# Patient Record
Sex: Female | Born: 1970
Health system: Southern US, Community
[De-identification: ages and names within clinical notes are randomized; demographics above are authoritative.]

## PROBLEM LIST (undated history)

## (undated) DIAGNOSIS — N390 Urinary tract infection, site not specified: Secondary | ICD-10-CM

## (undated) DIAGNOSIS — G43909 Migraine, unspecified, not intractable, without status migrainosus: Secondary | ICD-10-CM

## (undated) HISTORY — DX: Migraine, unspecified, not intractable, without status migrainosus: G43.909

## (undated) HISTORY — DX: Urinary tract infection, site not specified: N39.0

---

## 2009-05-15 ENCOUNTER — Emergency Department (HOSPITAL_COMMUNITY): Admission: EM | Admit: 2009-05-15 | Discharge: 2009-05-15 | Payer: Self-pay | Admitting: Emergency Medicine

## 2010-09-10 LAB — URINALYSIS, ROUTINE W REFLEX MICROSCOPIC
Hgb urine dipstick: NEGATIVE
Protein, ur: NEGATIVE mg/dL
Specific Gravity, Urine: 1.027 (ref 1.005–1.030)
Urobilinogen, UA: 1 mg/dL (ref 0.0–1.0)

## 2010-09-10 LAB — POCT I-STAT, CHEM 8
Calcium, Ion: 1.24 mmol/L (ref 1.12–1.32)
HCT: 38 % (ref 36.0–46.0)
Hemoglobin: 12.9 g/dL (ref 12.0–15.0)
Sodium: 138 mEq/L (ref 135–145)
TCO2: 25 mmol/L (ref 0–100)

## 2010-09-10 LAB — PREGNANCY, URINE: Preg Test, Ur: NEGATIVE

## 2010-09-10 LAB — RPR: RPR Ser Ql: NONREACTIVE

## 2015-04-06 ENCOUNTER — Ambulatory Visit: Payer: Self-pay | Admitting: Internal Medicine

## 2015-04-06 DIAGNOSIS — Z0289 Encounter for other administrative examinations: Secondary | ICD-10-CM

## 2015-06-07 ENCOUNTER — Ambulatory Visit (INDEPENDENT_AMBULATORY_CARE_PROVIDER_SITE_OTHER): Payer: BLUE CROSS/BLUE SHIELD | Admitting: Internal Medicine

## 2015-06-07 ENCOUNTER — Encounter: Payer: Self-pay | Admitting: Internal Medicine

## 2015-06-07 VITALS — BP 110/60 | HR 67 | Temp 98.4°F | Resp 14 | Ht 64.0 in | Wt 149.8 lb

## 2015-06-07 DIAGNOSIS — N926 Irregular menstruation, unspecified: Secondary | ICD-10-CM | POA: Diagnosis not present

## 2015-06-07 DIAGNOSIS — Z0000 Encounter for general adult medical examination without abnormal findings: Secondary | ICD-10-CM | POA: Diagnosis not present

## 2015-06-07 DIAGNOSIS — B379 Candidiasis, unspecified: Secondary | ICD-10-CM | POA: Diagnosis not present

## 2015-06-07 MED ORDER — FLUCONAZOLE 150 MG PO TABS: ORAL_TABLET | ORAL | Status: DC

## 2015-06-07 NOTE — Patient Instructions (Addendum)
We do not need any blood work today but we will get the records.   For the yeast infections we have sent in diflucan. Take 1 pill every 3 days for 2 weeks, then start taking 1 pill every week for 2 months.   Come back in about 6-12 months for a follow up. Call us with any problems or questions sooner if needed.   Health Maintenance, Female Adopting a healthy lifestyle and getting preventive care can go a long way to promote health and wellness. Talk with your health care provider about what schedule of regular examinations is right for you. This is a good chance for you to check in with your provider about disease prevention and staying healthy. In between checkups, there are plenty of things you can do on your own. Experts have done a lot of research about which lifestyle changes and preventive measures are most likely to keep you healthy. Ask your health care provider for more information. WEIGHT AND DIET  Eat a healthy diet  Be sure to include plenty of vegetables, fruits, low-fat dairy products, and lean protein.  Do not eat a lot of foods high in solid fats, added sugars, or salt.  Get regular exercise. This is one of the most important things you can do for your health.  Most adults should exercise for at least 150 minutes each week. The exercise should increase your heart rate and make you sweat (moderate-intensity exercise).  Most adults should also do strengthening exercises at least twice a week. This is in addition to the moderate-intensity exercise.  Maintain a healthy weight  Body mass index (BMI) is a measurement that can be used to identify possible weight problems. It estimates body fat based on height and weight. Your health care provider can help determine your BMI and help you achieve or maintain a healthy weight.  For females 75 years of age and older:   A BMI below 18.5 is considered underweight.  A BMI of 18.5 to 24.9 is normal.  A BMI of 25 to 29.9 is  considered overweight.  A BMI of 30 and above is considered obese.  Watch levels of cholesterol and blood lipids  You should start having your blood tested for lipids and cholesterol at 45 years of age, then have this test every 5 years.  You may need to have your cholesterol levels checked more often if:  Your lipid or cholesterol levels are high.  You are older than 45 years of age.  You are at high risk for heart disease.  CANCER SCREENING   Lung Cancer  Lung cancer screening is recommended for adults 79-54 years old who are at high risk for lung cancer because of a history of smoking.  A yearly low-dose CT scan of the lungs is recommended for people who:  Currently smoke.  Have quit within the past 15 years.  Have at least a 30-pack-year history of smoking. A pack year is smoking an average of one pack of cigarettes a day for 1 year.  Yearly screening should continue until it has been 15 years since you quit.  Yearly screening should stop if you develop a health problem that would prevent you from having lung cancer treatment.  Breast Cancer  Practice breast self-awareness. This means understanding how your breasts normally appear and feel.  It also means doing regular breast self-exams. Let your health care provider know about any changes, no matter how small.  If you are in your 80s  or 60s, you should have a clinical breast exam (CBE) by a health care provider every 1-3 years as part of a regular health exam.  If you are 51 or older, have a CBE every year. Also consider having a breast X-ray (mammogram) every year.  If you have a family history of breast cancer, talk to your health care provider about genetic screening.  If you are at high risk for breast cancer, talk to your health care provider about having an MRI and a mammogram every year.  Breast cancer gene (BRCA) assessment is recommended for women who have family members with BRCA-related cancers.  BRCA-related cancers include:  Breast.  Ovarian.  Tubal.  Peritoneal cancers.  Results of the assessment will determine the need for genetic counseling and BRCA1 and BRCA2 testing. Cervical Cancer Your health care provider may recommend that you be screened regularly for cancer of the pelvic organs (ovaries, uterus, and vagina). This screening involves a pelvic examination, including checking for microscopic changes to the surface of your cervix (Pap test). You may be encouraged to have this screening done every 3 years, beginning at age 30.  For women ages 78-65, health care providers may recommend pelvic exams and Pap testing every 3 years, or they may recommend the Pap and pelvic exam, combined with testing for human papilloma virus (HPV), every 5 years. Some types of HPV increase your risk of cervical cancer. Testing for HPV may also be done on women of any age with unclear Pap test results.  Other health care providers may not recommend any screening for nonpregnant women who are considered low risk for pelvic cancer and who do not have symptoms. Ask your health care provider if a screening pelvic exam is right for you.  If you have had past treatment for cervical cancer or a condition that could lead to cancer, you need Pap tests and screening for cancer for at least 20 years after your treatment. If Pap tests have been discontinued, your risk factors (such as having a new sexual partner) need to be reassessed to determine if screening should resume. Some women have medical problems that increase the chance of getting cervical cancer. In these cases, your health care provider may recommend more frequent screening and Pap tests. Colorectal Cancer  This type of cancer can be detected and often prevented.  Routine colorectal cancer screening usually begins at 45 years of age and continues through 45 years of age.  Your health care provider may recommend screening at an earlier age if you  have risk factors for colon cancer.  Your health care provider may also recommend using home test kits to check for hidden blood in the stool.  A small camera at the end of a tube can be used to examine your colon directly (sigmoidoscopy or colonoscopy). This is done to check for the earliest forms of colorectal cancer.  Routine screening usually begins at age 66.  Direct examination of the colon should be repeated every 5-10 years through 45 years of age. However, you may need to be screened more often if early forms of precancerous polyps or small growths are found. Skin Cancer  Check your skin from head to toe regularly.  Tell your health care provider about any new moles or changes in moles, especially if there is a change in a mole's shape or color.  Also tell your health care provider if you have a mole that is larger than the size of a pencil eraser.  Always use sunscreen. Apply sunscreen liberally and repeatedly throughout the day.  Protect yourself by wearing long sleeves, pants, a wide-brimmed hat, and sunglasses whenever you are outside. HEART DISEASE, DIABETES, AND HIGH BLOOD PRESSURE   High blood pressure causes heart disease and increases the risk of stroke. High blood pressure is more likely to develop in:  People who have blood pressure in the high end of the normal range (130-139/85-89 mm Hg).  People who are overweight or obese.  People who are African American.  If you are 90-33 years of age, have your blood pressure checked every 3-5 years. If you are 12 years of age or older, have your blood pressure checked every year. You should have your blood pressure measured twice--once when you are at a hospital or clinic, and once when you are not at a hospital or clinic. Record the average of the two measurements. To check your blood pressure when you are not at a hospital or clinic, you can use:  An automated blood pressure machine at a pharmacy.  A home blood pressure  monitor.  If you are between 24 years and 60 years old, ask your health care provider if you should take aspirin to prevent strokes.  Have regular diabetes screenings. This involves taking a blood sample to check your fasting blood sugar level.  If you are at a normal weight and have a low risk for diabetes, have this test once every three years after 44 years of age.  If you are overweight and have a high risk for diabetes, consider being tested at a younger age or more often. PREVENTING INFECTION  Hepatitis B  If you have a higher risk for hepatitis B, you should be screened for this virus. You are considered at high risk for hepatitis B if:  You were born in a country where hepatitis B is common. Ask your health care provider which countries are considered high risk.  Your parents were born in a high-risk country, and you have not been immunized against hepatitis B (hepatitis B vaccine).  You have HIV or AIDS.  You use needles to inject street drugs.  You live with someone who has hepatitis B.  You have had sex with someone who has hepatitis B.  You get hemodialysis treatment.  You take certain medicines for conditions, including cancer, organ transplantation, and autoimmune conditions. Hepatitis C  Blood testing is recommended for:  Everyone born from [birth year] through 1966.  Anyone with known risk factors for hepatitis C. Sexually transmitted infections (STIs)  You should be screened for sexually transmitted infections (STIs) including gonorrhea and chlamydia if:  You are sexually active and are younger than 45 years of age.  You are older than 45 years of age and your health care provider tells you that you are at risk for this type of infection.  Your sexual activity has changed since you were last screened and you are at an increased risk for chlamydia or gonorrhea. Ask your health care provider if you are at risk.  If you do not have HIV, but are at risk, it may be  recommended that you take a prescription medicine daily to prevent HIV infection. This is called pre-exposure prophylaxis (PrEP). You are considered at risk if:  You are sexually active and do not regularly use condoms or know the HIV status of your partner(s).  You take drugs by injection.  You are sexually active with a partner who has HIV. Talk with your health  care provider about whether you are at high risk of being infected with HIV. If you choose to begin PrEP, you should first be tested for HIV. You should then be tested every 3 months for as long as you are taking PrEP.  PREGNANCY   If you are premenopausal and you may become pregnant, ask your health care provider about preconception counseling.  If you may become pregnant, take 400 to 800 micrograms (mcg) of folic acid every day.  If you want to prevent pregnancy, talk to your health care provider about birth control (contraception). OSTEOPOROSIS AND MENOPAUSE   Osteoporosis is a disease in which the bones lose minerals and strength with aging. This can result in serious bone fractures. Your risk for osteoporosis can be identified using a bone density scan.  If you are 52 years of age or older, or if you are at risk for osteoporosis and fractures, ask your health care provider if you should be screened.  Ask your health care provider whether you should take a calcium or vitamin D supplement to lower your risk for osteoporosis.  Menopause may have certain physical symptoms and risks.  Hormone replacement therapy may reduce some of these symptoms and risks. Talk to your health care provider about whether hormone replacement therapy is right for you.  HOME CARE INSTRUCTIONS   Schedule regular health, dental, and eye exams.  Stay current with your immunizations.   Do not use any tobacco products including cigarettes, chewing tobacco, or electronic cigarettes.  If you are pregnant, do not drink alcohol.  If you are  breastfeeding, limit how much and how often you drink alcohol.  Limit alcohol intake to no more than 1 drink per day for nonpregnant women. One drink equals 12 ounces of beer, 5 ounces of wine, or 1 ounces of hard liquor.  Do not use street drugs.  Do not share needles.  Ask your health care provider for help if you need support or information about quitting drugs.  Tell your health care provider if you often feel depressed.  Tell your health care provider if you have ever been abused or do not feel safe at home.   This information is not intended to replace advice given to you by your health care provider. Make sure you discuss any questions you have with your health care provider.   Document Released: 12/09/2010 Document Revised: 06/16/2014 Document Reviewed: 04/27/2013 Elsevier Interactive Patient Education [employer].

## 2015-06-07 NOTE — Progress Notes (Signed)
Pre visit review using our clinic review tool, if applicable. No additional management support is needed unless otherwise documented below in the visit note. ° °

## 2015-06-07 NOTE — Progress Notes (Signed)
   Subjective:    Patient ID: Lauren Olson, female    DOB: 09/16/1970, 44 y.o.   MRN: [id number]  HPI The patient is a 44 YO female coming in new for recurrent yeast infections. They are associated with intercourse causing them. She is concerned and this has been tested for STDs in the last several months. LMP date unknown but she thinks in the last 6-8 weeks. Has had unprotected sex in that time.   [facility] reviewed and updated.   Review of Systems  Constitutional: Negative for fever, activity change, appetite change, fatigue and unexpected weight change.  HENT: Negative.   Eyes: Negative.   Respiratory: Negative for cough, chest tightness, shortness of breath and wheezing.   Cardiovascular: Negative for chest pain, palpitations and leg swelling.  Gastrointestinal: Negative for nausea, abdominal pain, diarrhea, constipation and abdominal distention.  Genitourinary:       Recurrent yeast infection.   Musculoskeletal: Negative.   Skin: Negative.   Neurological: Negative.   Psychiatric/Behavioral: Negative.       Objective:   Physical Exam  Constitutional: She is oriented to person, place, and time. She appears well-developed and well-nourished.  HENT:  Head: Normocephalic and atraumatic.  Eyes: EOM are normal.  Neck: Normal range of motion.  Cardiovascular: Normal rate and regular rhythm.   Pulmonary/Chest: Effort normal and breath sounds normal. No respiratory distress. She has no wheezes. She has no rales.  Abdominal: Soft. Bowel sounds are normal. She exhibits no distension. There is no tenderness. There is no rebound.  Musculoskeletal: She exhibits no edema.  Neurological: She is alert and oriented to person, place, and time. Coordination normal.  Skin: Skin is warm and dry.  Psychiatric: She has a normal mood and affect.   Filed Vitals:   06/07/15 1556  BP: 110/60  Pulse: 67  Temp: 98.4 F (36.9 C)  TempSrc: Oral  Resp: 14  Height: 5\' 4"  (1.626 m)   Weight: 149 lb 12.8 oz (67.949 kg)  SpO2: 99%      Assessment & Plan:

## 2015-06-08 DIAGNOSIS — B379 Candidiasis, unspecified: Secondary | ICD-10-CM | POA: Insufficient documentation

## 2015-06-08 NOTE — Assessment & Plan Note (Signed)
Checking labs, non-smoker and exercises.

## 2015-06-08 NOTE — Assessment & Plan Note (Signed)
Rx for diflucan every 3 days for 2 weeks, then weekly for 2 months.

## 2015-11-19 ENCOUNTER — Telehealth: Payer: Self-pay | Admitting: Internal Medicine

## 2015-11-19 MED ORDER — FLUCONAZOLE 150 MG PO TABS: ORAL_TABLET | ORAL | Status: DC

## 2015-11-19 NOTE — Telephone Encounter (Signed)
Pt called in and would like you to call her when you get a chance.  She would not tell me why was going on.  She just requested that you call her

## 2015-11-19 NOTE — Telephone Encounter (Signed)
Patient is still getting yeast infections. She said the diflucan helped, but she is out. She sees you in two weeks. She wants to know if you can send in more diflucan. She has also scheduled an appt with her gynecologist. She says she just needs some relief until she can get in with her gynecologist.

## 2015-11-19 NOTE — Telephone Encounter (Signed)
Patient aware.

## 2015-11-19 NOTE — Telephone Encounter (Signed)
Sent in refill.

## 2015-11-22 ENCOUNTER — Other Ambulatory Visit: Payer: Self-pay | Admitting: Obstetrics and Gynecology

## 2015-11-22 ENCOUNTER — Other Ambulatory Visit (HOSPITAL_COMMUNITY)
Admission: RE | Admit: 2015-11-22 | Discharge: 2015-11-22 | Disposition: A | Discharge status: Home / Self Care | Payer: BLUE CROSS/BLUE SHIELD | Source: Ambulatory Visit | Attending: Obstetrics and Gynecology | Admitting: Obstetrics and Gynecology

## 2015-11-22 DIAGNOSIS — Z113 Encounter for screening for infections with a predominantly sexual mode of transmission: Secondary | ICD-10-CM | POA: Diagnosis present

## 2015-11-22 DIAGNOSIS — Z01419 Encounter for gynecological examination (general) (routine) without abnormal findings: Secondary | ICD-10-CM | POA: Diagnosis present

## 2015-11-22 DIAGNOSIS — Z1151 Encounter for screening for human papillomavirus (HPV): Secondary | ICD-10-CM | POA: Insufficient documentation

## 2015-11-22 DIAGNOSIS — N761 Subacute and chronic vaginitis: Secondary | ICD-10-CM | POA: Diagnosis not present

## 2015-11-22 DIAGNOSIS — N750 Cyst of Bartholin's gland: Secondary | ICD-10-CM | POA: Diagnosis not present

## 2015-11-22 DIAGNOSIS — Z01411 Encounter for gynecological examination (general) (routine) with abnormal findings: Secondary | ICD-10-CM | POA: Diagnosis not present

## 2015-11-22 DIAGNOSIS — B373 Candidiasis of vulva and vagina: Secondary | ICD-10-CM | POA: Diagnosis not present

## 2015-11-26 LAB — CYTOLOGY - PAP

## 2015-12-03 ENCOUNTER — Ambulatory Visit (INDEPENDENT_AMBULATORY_CARE_PROVIDER_SITE_OTHER): Payer: BLUE CROSS/BLUE SHIELD | Admitting: Internal Medicine

## 2015-12-03 ENCOUNTER — Encounter: Payer: Self-pay | Admitting: Internal Medicine

## 2015-12-03 VITALS — BP 122/58 | HR 88 | Temp 98.7°F | Resp 12 | Ht 64.0 in | Wt 141.0 lb

## 2015-12-03 DIAGNOSIS — B379 Candidiasis, unspecified: Secondary | ICD-10-CM

## 2015-12-03 NOTE — Progress Notes (Signed)
Pre visit review using our clinic review tool, if applicable. No additional management support is needed unless otherwise documented below in the visit note. ° °

## 2015-12-03 NOTE — Patient Instructions (Signed)
It is worth trying to take a probiotic for 1 month to see if this helps, both you and your husband.

## 2015-12-03 NOTE — Progress Notes (Signed)
° °  Subjective:    Patient ID: Lauren Olson, female    DOB: August 03, 1970, 45 y.o.   MRN: [id number]  HPI The patient is a 45 YO female coming in for follow up of her yeast infections. She was doing well after being on the medicine for about 2 months. She started getting them back again several weeks ago. Seeing gynecologist and they are working on it. Still with a lot of problems after sexual activity.   Review of Systems  Constitutional: Negative for fever, activity change, appetite change, fatigue and unexpected weight change.  Respiratory: Negative for cough, chest tightness, shortness of breath and wheezing.   Cardiovascular: Negative for chest pain, palpitations and leg swelling.  Gastrointestinal: Negative for nausea, abdominal pain, diarrhea, constipation and abdominal distention.  Genitourinary:       Recurrent yeast infection.   Musculoskeletal: Negative.   Skin: Negative.   Neurological: Negative.   Psychiatric/Behavioral: Negative.       Objective:   Physical Exam  Constitutional: She is oriented to person, place, and time. She appears well-developed and well-nourished.  HENT:  Head: Normocephalic and atraumatic.  Eyes: EOM are normal.  Neck: Normal range of motion.  Cardiovascular: Normal rate and regular rhythm.   Pulmonary/Chest: Effort normal and breath sounds normal. No respiratory distress. She has no wheezes. She has no rales.  Abdominal: Soft. Bowel sounds are normal. She exhibits no distension. There is no tenderness. There is no rebound.  Musculoskeletal: She exhibits no edema.  Neurological: She is alert and oriented to person, place, and time. Coordination normal.  Skin: Skin is warm and dry.   Filed Vitals:   12/03/15 1603  BP: 122/58  Pulse: 88  Temp: 98.7 F (37.1 C)  TempSrc: Oral  Resp: 12  Height: 5\' 4"  (1.626 m)  Weight: 141 lb (63.957 kg)  SpO2: 99%      Assessment & Plan:

## 2015-12-04 NOTE — Assessment & Plan Note (Signed)
Talked to them about taking probiotic both her and her husband to see if this helps.

## 2016-02-18 ENCOUNTER — Other Ambulatory Visit: Payer: Self-pay | Admitting: Obstetrics and Gynecology

## 2016-02-18 DIAGNOSIS — Z1231 Encounter for screening mammogram for malignant neoplasm of breast: Secondary | ICD-10-CM

## 2016-02-26 ENCOUNTER — Ambulatory Visit
Admission: RE | Admit: 2016-02-26 | Discharge: 2016-02-26 | Disposition: A | Discharge status: Home / Self Care | Payer: BLUE CROSS/BLUE SHIELD | Source: Ambulatory Visit | Attending: Obstetrics and Gynecology | Admitting: Obstetrics and Gynecology

## 2016-02-26 DIAGNOSIS — Z1231 Encounter for screening mammogram for malignant neoplasm of breast: Secondary | ICD-10-CM

## 2016-02-27 DIAGNOSIS — M9903 Segmental and somatic dysfunction of lumbar region: Secondary | ICD-10-CM | POA: Diagnosis not present

## 2016-02-27 DIAGNOSIS — M5386 Other specified dorsopathies, lumbar region: Secondary | ICD-10-CM | POA: Diagnosis not present

## 2016-02-27 DIAGNOSIS — M9905 Segmental and somatic dysfunction of pelvic region: Secondary | ICD-10-CM | POA: Diagnosis not present

## 2016-02-27 DIAGNOSIS — M9902 Segmental and somatic dysfunction of thoracic region: Secondary | ICD-10-CM | POA: Diagnosis not present

## 2016-03-19 ENCOUNTER — Other Ambulatory Visit (INDEPENDENT_AMBULATORY_CARE_PROVIDER_SITE_OTHER): Payer: BLUE CROSS/BLUE SHIELD

## 2016-03-19 ENCOUNTER — Ambulatory Visit (INDEPENDENT_AMBULATORY_CARE_PROVIDER_SITE_OTHER): Payer: BLUE CROSS/BLUE SHIELD | Admitting: Internal Medicine

## 2016-03-19 ENCOUNTER — Encounter: Payer: Self-pay | Admitting: Internal Medicine

## 2016-03-19 VITALS — BP 100/70 | HR 71 | Temp 98.4°F | Resp 14 | Ht 65.0 in | Wt 139.0 lb

## 2016-03-19 DIAGNOSIS — Z0000 Encounter for general adult medical examination without abnormal findings: Secondary | ICD-10-CM | POA: Diagnosis not present

## 2016-03-19 LAB — COMPREHENSIVE METABOLIC PANEL
ALT: 8 U/L (ref 0–35)
AST: 21 U/L (ref 0–37)
Albumin: 4.1 g/dL (ref 3.5–5.2)
Alkaline Phosphatase: 58 U/L (ref 39–117)
BUN: 10 mg/dL (ref 6–23)
CO2: 26 meq/L (ref 19–32)
Calcium: 9.4 mg/dL (ref 8.4–10.5)
Chloride: 105 mEq/L (ref 96–112)
Creatinine, Ser: 0.69 mg/dL (ref 0.40–1.20)
GFR: 117.77 mL/min (ref >60.00)
GLUCOSE: 86 mg/dL (ref 70–99)
POTASSIUM: 3.9 meq/L (ref 3.5–5.1)
Sodium: 139 mEq/L (ref 135–145)
Total Bilirubin: 0.3 mg/dL (ref 0.2–1.2)
Total Protein: 7.9 g/dL (ref 6.0–8.3)

## 2016-03-19 LAB — URINALYSIS, ROUTINE W REFLEX MICROSCOPIC
Bilirubin Urine: NEGATIVE
HGB URINE DIPSTICK: NEGATIVE
LEUKOCYTES UA: NEGATIVE
Nitrite: NEGATIVE
Specific Gravity, Urine: 1.015 (ref 1.000–1.030)
Total Protein, Urine: NEGATIVE
URINE GLUCOSE: NEGATIVE
Urobilinogen, UA: 1 (ref 0.0–1.0)
pH: 7 (ref 5.0–8.0)

## 2016-03-19 LAB — LIPID PANEL
CHOL/HDL RATIO: 3
Cholesterol: 154 mg/dL (ref 0–200)
HDL: 59.9 mg/dL (ref >39.00)
LDL Cholesterol: 82 mg/dL (ref 0–99)
NONHDL: 94.24
Triglycerides: 63 mg/dL (ref 0.0–149.0)
VLDL: 12.6 mg/dL (ref 0.0–40.0)

## 2016-03-19 LAB — CBC
HEMATOCRIT: 30.5 % — AB (ref 36.0–46.0)
HEMOGLOBIN: 9.6 g/dL — AB (ref 12.0–15.0)
MCHC: 31.4 g/dL (ref 30.0–36.0)
MCV: 75.4 fl — AB (ref 78.0–100.0)
PLATELETS: 308 10*3/uL (ref 150.0–400.0)
RBC: 4.04 Mil/uL (ref 3.87–5.11)
RDW: 16.7 % — ABNORMAL HIGH (ref 11.5–15.5)
WBC: 5.7 10*3/uL (ref 4.0–10.5)

## 2016-03-19 NOTE — Progress Notes (Signed)
Pre visit review using our clinic review tool, if applicable. No additional management support is needed unless otherwise documented below in the visit note. ° °

## 2016-03-19 NOTE — Assessment & Plan Note (Signed)
Checking labs, form filled out for her work. Declines flu shot and hiv screening need. Counseled on exercise and dangers of distracted driving. Given screening recommendations. Not taking any meds.

## 2016-03-19 NOTE — Patient Instructions (Signed)
We will check the labs and the urine today and will call you back about the results.  Keep up the good work with the health.   Health Maintenance, Female Adopting a healthy lifestyle and getting preventive care can go a long way to promote health and wellness. Talk with your health care provider about what schedule of regular examinations is right for you. This is a good chance for you to check in with your provider about disease prevention and staying healthy. In between checkups, there are plenty of things you can do on your own. Experts have done a lot of research about which lifestyle changes and preventive measures are most likely to keep you healthy. Ask your health care provider for more information. WEIGHT AND DIET  Eat a healthy diet  Be sure to include plenty of vegetables, fruits, low-fat dairy products, and lean protein.  Do not eat a lot of foods high in solid fats, added sugars, or salt.  Get regular exercise. This is one of the most important things you can do for your health.  Most adults should exercise for at least 150 minutes each week. The exercise should increase your heart rate and make you sweat (moderate-intensity exercise).  Most adults should also do strengthening exercises at least twice a week. This is in addition to the moderate-intensity exercise.  Maintain a healthy weight  Body mass index (BMI) is a measurement that can be used to identify possible weight problems. It estimates body fat based on height and weight. Your health care provider can help determine your BMI and help you achieve or maintain a healthy weight.  For females 68 years of age and older:   A BMI below 18.5 is considered underweight.  A BMI of 18.5 to 24.9 is normal.  A BMI of 25 to 29.9 is considered overweight.  A BMI of 30 and above is considered obese.  Watch levels of cholesterol and blood lipids  You should start having your blood tested for lipids and cholesterol at 46  years of age, then have this test every 5 years.  You may need to have your cholesterol levels checked more often if:  Your lipid or cholesterol levels are high.  You are older than 47 years of age.  You are at high risk for heart disease.  CANCER SCREENING   Lung Cancer  Lung cancer screening is recommended for adults 29-12 years old who are at high risk for lung cancer because of a history of smoking.  A yearly low-dose CT scan of the lungs is recommended for people who:  Currently smoke.  Have quit within the past 15 years.  Have at least a 30-pack-year history of smoking. A pack year is smoking an average of one pack of cigarettes a day for 1 year.  Yearly screening should continue until it has been 15 years since you quit.  Yearly screening should stop if you develop a health problem that would prevent you from having lung cancer treatment.  Breast Cancer  Practice breast self-awareness. This means understanding how your breasts normally appear and feel.  It also means doing regular breast self-exams. Let your health care provider know about any changes, no matter how small.  If you are in your 20s or 30s, you should have a clinical breast exam (CBE) by a health care provider every 1-3 years as part of a regular health exam.  If you are 49 or older, have a CBE every year. Also consider having  a breast X-ray (mammogram) every year.  If you have a family history of breast cancer, talk to your health care provider about genetic screening.  If you are at high risk for breast cancer, talk to your health care provider about having an MRI and a mammogram every year.  Breast cancer gene (BRCA) assessment is recommended for women who have family members with BRCA-related cancers. BRCA-related cancers include:  Breast.  Ovarian.  Tubal.  Peritoneal cancers.  Results of the assessment will determine the need for genetic counseling and BRCA1 and BRCA2 testing. Cervical  Cancer Your health care provider may recommend that you be screened regularly for cancer of the pelvic organs (ovaries, uterus, and vagina). This screening involves a pelvic examination, including checking for microscopic changes to the surface of your cervix (Pap test). You may be encouraged to have this screening done every 3 years, beginning at age 73.  For women ages 81-65, health care providers may recommend pelvic exams and Pap testing every 3 years, or they may recommend the Pap and pelvic exam, combined with testing for human papilloma virus (HPV), every 5 years. Some types of HPV increase your risk of cervical cancer. Testing for HPV may also be done on women of any age with unclear Pap test results.  Other health care providers may not recommend any screening for nonpregnant women who are considered low risk for pelvic cancer and who do not have symptoms. Ask your health care provider if a screening pelvic exam is right for you.  If you have had past treatment for cervical cancer or a condition that could lead to cancer, you need Pap tests and screening for cancer for at least 20 years after your treatment. If Pap tests have been discontinued, your risk factors (such as having a new sexual partner) need to be reassessed to determine if screening should resume. Some women have medical problems that increase the chance of getting cervical cancer. In these cases, your health care provider may recommend more frequent screening and Pap tests. Colorectal Cancer  This type of cancer can be detected and often prevented.  Routine colorectal cancer screening usually begins at 46 years of age and continues through 46 years of age.  Your health care provider may recommend screening at an earlier age if you have risk factors for colon cancer.  Your health care provider may also recommend using home test kits to check for hidden blood in the stool.  A small camera at the end of a tube can be used to  examine your colon directly (sigmoidoscopy or colonoscopy). This is done to check for the earliest forms of colorectal cancer.  Routine screening usually begins at age 83.  Direct examination of the colon should be repeated every 5-10 years through 46 years of age. However, you may need to be screened more often if early forms of precancerous polyps or small growths are found. Skin Cancer  Check your skin from head to toe regularly.  Tell your health care provider about any new moles or changes in moles, especially if there is a change in a mole's shape or color.  Also tell your health care provider if you have a mole that is larger than the size of a pencil eraser.  Always use sunscreen. Apply sunscreen liberally and repeatedly throughout the day.  Protect yourself by wearing long sleeves, pants, a wide-brimmed hat, and sunglasses whenever you are outside. HEART DISEASE, DIABETES, AND HIGH BLOOD PRESSURE   High blood pressure  causes heart disease and increases the risk of stroke. High blood pressure is more likely to develop in:  People who have blood pressure in the high end of the normal range (130-139/85-89 mm Hg).  People who are overweight or obese.  People who are African American.  If you are 52-22 years of age, have your blood pressure checked every 3-5 years. If you are 33 years of age or older, have your blood pressure checked every year. You should have your blood pressure measured twice--once when you are at a hospital or clinic, and once when you are not at a hospital or clinic. Record the average of the two measurements. To check your blood pressure when you are not at a hospital or clinic, you can use:  An automated blood pressure machine at a pharmacy.  A home blood pressure monitor.  If you are between 62 years and 48 years old, ask your health care provider if you should take aspirin to prevent strokes.  Have regular diabetes screenings. This involves taking a  blood sample to check your fasting blood sugar level.  If you are at a normal weight and have a low risk for diabetes, have this test once every three years after 47 years of age.  If you are overweight and have a high risk for diabetes, consider being tested at a younger age or more often. PREVENTING INFECTION  Hepatitis B  If you have a higher risk for hepatitis B, you should be screened for this virus. You are considered at high risk for hepatitis B if:  You were born in a country where hepatitis B is common. Ask your health care provider which countries are considered high risk.  Your parents were born in a high-risk country, and you have not been immunized against hepatitis B (hepatitis B vaccine).  You have HIV or AIDS.  You use needles to inject street drugs.  You live with someone who has hepatitis B.  You have had sex with someone who has hepatitis B.  You get hemodialysis treatment.  You take certain medicines for conditions, including cancer, organ transplantation, and autoimmune conditions. Hepatitis C  Blood testing is recommended for:  Everyone born from [birth year] through 1966.  Anyone with known risk factors for hepatitis C. Sexually transmitted infections (STIs)  You should be screened for sexually transmitted infections (STIs) including gonorrhea and chlamydia if:  You are sexually active and are younger than 46 years of age.  You are older than 46 years of age and your health care provider tells you that you are at risk for this type of infection.  Your sexual activity has changed since you were last screened and you are at an increased risk for chlamydia or gonorrhea. Ask your health care provider if you are at risk.  If you do not have HIV, but are at risk, it may be recommended that you take a prescription medicine daily to prevent HIV infection. This is called pre-exposure prophylaxis (PrEP). You are considered at risk if:  You are sexually active and do  not regularly use condoms or know the HIV status of your partner(s).  You take drugs by injection.  You are sexually active with a partner who has HIV. Talk with your health care provider about whether you are at high risk of being infected with HIV. If you choose to begin PrEP, you should first be tested for HIV. You should then be tested every 3 months for as long as you  are taking PrEP.  PREGNANCY   If you are premenopausal and you may become pregnant, ask your health care provider about preconception counseling.  If you may become pregnant, take 400 to 800 micrograms (mcg) of folic acid every day.  If you want to prevent pregnancy, talk to your health care provider about birth control (contraception). OSTEOPOROSIS AND MENOPAUSE   Osteoporosis is a disease in which the bones lose minerals and strength with aging. This can result in serious bone fractures. Your risk for osteoporosis can be identified using a bone density scan.  If you are 13 years of age or older, or if you are at risk for osteoporosis and fractures, ask your health care provider if you should be screened.  Ask your health care provider whether you should take a calcium or vitamin D supplement to lower your risk for osteoporosis.  Menopause may have certain physical symptoms and risks.  Hormone replacement therapy may reduce some of these symptoms and risks. Talk to your health care provider about whether hormone replacement therapy is right for you.  HOME CARE INSTRUCTIONS   Schedule regular health, dental, and eye exams.  Stay current with your immunizations.   Do not use any tobacco products including cigarettes, chewing tobacco, or electronic cigarettes.  If you are pregnant, do not drink alcohol.  If you are breastfeeding, limit how much and how often you drink alcohol.  Limit alcohol intake to no more than 1 drink per day for nonpregnant women. One drink equals 12 ounces of beer, 5 ounces of wine, or 1  ounces of hard liquor.  Do not use street drugs.  Do not share needles.  Ask your health care provider for help if you need support or information about quitting drugs.  Tell your health care provider if you often feel depressed.  Tell your health care provider if you have ever been abused or do not feel safe at home.   This information is not intended to replace advice given to you by your health care provider. Make sure you discuss any questions you have with your health care provider.   Document Released: 12/09/2010 Document Revised: 06/16/2014 Document Reviewed: 04/27/2013 Elsevier Interactive Patient Education [employer].

## 2016-03-19 NOTE — Progress Notes (Signed)
° °  Subjective:    Patient ID: Lauren Olson, female    DOB: 08/05/1970, 45 y.o.   MRN: [id number]  HPI The patient is a 45 YO female coming in for wellness. No new concerns.   [facility] reviewed and updated.   Review of Systems  Constitutional: Negative.   HENT: Negative.   Eyes: Negative.   Respiratory: Negative.   Cardiovascular: Negative.   Gastrointestinal: Negative.   Musculoskeletal: Negative.   Skin: Negative.   Neurological: Negative.   Psychiatric/Behavioral: Negative.       Objective:   Physical Exam  Constitutional: She is oriented to person, place, and time. She appears well-developed and well-nourished.  HENT:  Head: Normocephalic and atraumatic.  Eyes: EOM are normal.  Neck: Normal range of motion.  Cardiovascular: Normal rate and regular rhythm.   Pulmonary/Chest: Effort normal and breath sounds normal. No respiratory distress. She has no wheezes. She has no rales.  Abdominal: Soft. Bowel sounds are normal.  Musculoskeletal: She exhibits no edema.  Neurological: She is alert and oriented to person, place, and time.  Skin: Skin is warm and dry.  Psychiatric: She has a normal mood and affect.   Vitals:   03/19/16 1031  BP: 100/70  Pulse: 71  Resp: 14  Temp: 98.4 F (36.9 C)  TempSrc: Oral  SpO2: 99%  Weight: 139 lb (63 kg)  Height: 5\' 5"  (1.651 m)      Assessment & Plan:

## 2016-06-19 ENCOUNTER — Ambulatory Visit (INDEPENDENT_AMBULATORY_CARE_PROVIDER_SITE_OTHER): Payer: BLUE CROSS/BLUE SHIELD | Admitting: Internal Medicine

## 2016-06-19 ENCOUNTER — Encounter: Payer: Self-pay | Admitting: Internal Medicine

## 2016-06-19 DIAGNOSIS — R300 Dysuria: Secondary | ICD-10-CM

## 2016-06-19 LAB — POC URINALSYSI DIPSTICK (AUTOMATED)
Bilirubin, UA: NEGATIVE
GLUCOSE UA: NEGATIVE
Nitrite, UA: POSITIVE
RBC UA: NEGATIVE
SPEC GRAV UA: 1.02
UROBILINOGEN UA: 2
pH, UA: 6.5

## 2016-06-19 MED ORDER — SULFAMETHOXAZOLE-TRIMETHOPRIM 800-160 MG PO TABS: 1.0000 | ORAL_TABLET | Freq: Two times a day (BID) | ORAL | 0 refills | Status: DC

## 2016-06-19 MED ORDER — FLUCONAZOLE 150 MG PO TABS: 150.0000 mg | ORAL_TABLET | Freq: Once | ORAL | 0 refills | Status: AC

## 2016-06-19 NOTE — Patient Instructions (Signed)
You do have a bladder infection. We have sent in the antibiotic called bactrim. Take 1 pill twice a day for 5 days.   We have also sent in a diflucan pill in case you get a yeast infection from the antibiotic.    Urinary Tract Infection, Adult Introduction A urinary tract infection (UTI) is an infection of any part of the urinary tract. The urinary tract includes the:  Kidneys.  Ureters.  Bladder.  Urethra. These organs make, store, and get rid of pee (urine) in the body. Follow these instructions at home:  Take over-the-counter and prescription medicines only as told by your doctor.  If you were prescribed an antibiotic medicine, take it as told by your doctor. Do not stop taking the antibiotic even if you start to feel better.  Avoid the following drinks:  Alcohol.  Caffeine.  Tea.  Carbonated drinks.  Drink enough fluid to keep your pee clear or pale yellow.  Keep all follow-up visits as told by your doctor. This is important.  Make sure to:  Empty your bladder often and completely. Do not to hold pee for long periods of time.  Empty your bladder before and after sex.  Wipe from front to back after a bowel movement if you are female. Use each tissue one time when you wipe. Contact a doctor if:  You have back pain.  You have a fever.  You feel sick to your stomach (nauseous).  You throw up (vomit).  Your symptoms do not get better after 3 days.  Your symptoms go away and then come back. Get help right away if:  You have very bad back pain.  You have very bad lower belly (abdominal) pain.  You are throwing up and cannot keep down any medicines or water. This information is not intended to replace advice given to you by your health care provider. Make sure you discuss any questions you have with your health care provider. Document Released: 11/12/2007 Document Revised: 11/01/2015 Document Reviewed: 04/16/2015  2017 Elsevier

## 2016-06-19 NOTE — Progress Notes (Signed)
° °  Subjective:    Patient ID: Lauren Olson, female    DOB: 1971-01-14, 46 y.o.   MRN: [id number]  HPI The patient is a 46 YO female coming in for UTI symptoms. Started 2 weeks ago. No fevers or chills. No abdominal or back pain. Some suprapubic tenderness. Pain with urination and urgency and frequency. Tried drinking more fluids and also cranberry juice without relief.   Review of Systems  Constitutional: Negative for activity change, appetite change, chills, fatigue, fever and unexpected weight change.  Respiratory: Negative.   Cardiovascular: Negative.   Gastrointestinal: Positive for abdominal pain. Negative for abdominal distention, constipation, diarrhea and nausea.  Genitourinary: Positive for dysuria, frequency and urgency. Negative for enuresis, flank pain, hematuria and pelvic pain.  Musculoskeletal: Negative.       Objective:   Physical Exam  Constitutional: She is oriented to person, place, and time. She appears well-developed and well-nourished.  HENT:  Head: Normocephalic and atraumatic.  Eyes: EOM are normal.  Neck: Normal range of motion.  Cardiovascular: Normal rate and regular rhythm.   Pulmonary/Chest: Effort normal and breath sounds normal.  Abdominal: Soft. She exhibits no distension. There is tenderness. There is no rebound and no guarding.  Some suprapubic tenderness, no guarding or rebound.   Neurological: She is alert and oriented to person, place, and time.  Skin: Skin is warm and dry.   Vitals:   06/19/16 0909  BP: 110/68  Pulse: 85  Resp: 12  Temp: 98.2 F (36.8 C)  TempSrc: Oral  SpO2: 99%  Weight: 134 lb (60.8 kg)  Height: 5\' 5"  (1.651 m)      Assessment & Plan:

## 2016-06-19 NOTE — Assessment & Plan Note (Signed)
U/A in the office consistent with infection. Rx for bactrim and diflucan since she usually gets yeast infection with treatment.

## 2016-06-19 NOTE — Progress Notes (Signed)
Pre visit review using our clinic review tool, if applicable. No additional management support is needed unless otherwise documented below in the visit note. ° °

## 2016-06-24 ENCOUNTER — Other Ambulatory Visit: Payer: Self-pay | Admitting: Internal Medicine

## 2016-06-24 ENCOUNTER — Telehealth: Payer: Self-pay | Admitting: Internal Medicine

## 2016-06-24 NOTE — Telephone Encounter (Signed)
Patient states when she was last in on her AVS it states a medication for possible yeast infection was sent to her pharmacy.  Patient states pharmacy did not receive this medication.  Patient uses [facility] on [address].

## 2016-06-24 NOTE — Telephone Encounter (Signed)
This was sent in to her pharmacy at the visit.

## 2016-07-07 ENCOUNTER — Ambulatory Visit: Payer: BLUE CROSS/BLUE SHIELD | Admitting: Internal Medicine

## 2016-07-08 ENCOUNTER — Encounter: Payer: Self-pay | Admitting: Internal Medicine

## 2016-07-08 ENCOUNTER — Ambulatory Visit (INDEPENDENT_AMBULATORY_CARE_PROVIDER_SITE_OTHER): Payer: BLUE CROSS/BLUE SHIELD | Admitting: Internal Medicine

## 2016-07-08 DIAGNOSIS — B379 Candidiasis, unspecified: Secondary | ICD-10-CM

## 2016-07-08 NOTE — Assessment & Plan Note (Signed)
Advised to see her gyn to see if there are any alternatives for this chronic yeast infection or other options for prevention.

## 2016-07-08 NOTE — Progress Notes (Signed)
Pre visit review using our clinic review tool, if applicable. No additional management support is needed unless otherwise documented below in the visit note. ° °

## 2016-07-08 NOTE — Progress Notes (Signed)
° °  Subjective:    Patient ID: Lauren Olson, female    DOB: 03-04-1971, 46 y.o.   MRN: [id number]  HPI The patient is a 46 YO female coming in for recurrent yeast infections. She gets them anytime she is intimate with her husband and this is very distressing to him. He is the one who scheduled this visit. She denies any dryness or pain with intercourse. She was on treatment for a while with weekly diflucan and still had problems with intimacy. She talked to her gyn and was blown off. She has not been intimate in some time and feels good and not having yeast infection now.   Review of Systems  Constitutional: Negative.   Respiratory: Negative.   Cardiovascular: Negative.   Gastrointestinal: Negative.   Genitourinary: Negative.   Neurological: Negative.       Objective:   Physical Exam  Constitutional: She is oriented to person, place, and time. She appears well-developed and well-nourished.  HENT:  Head: Normocephalic and atraumatic.  Eyes: EOM are normal.  Cardiovascular: Normal rate and regular rhythm.   Pulmonary/Chest: Effort normal.  Abdominal: Soft. She exhibits no distension. There is no tenderness. There is no rebound.  Neurological: She is alert and oriented to person, place, and time.  Skin: Skin is warm and dry.   Vitals:   07/08/16 1418  BP: 108/62  Pulse: (!) 117  Resp: 14  Temp: 98.2 F (36.8 C)  TempSrc: Oral  SpO2: 98%  Weight: 141 lb (64 kg)  Height: 5\' 5"  (1.651 m)      Assessment & Plan:

## 2016-09-30 ENCOUNTER — Encounter: Payer: Self-pay | Admitting: Internal Medicine

## 2016-09-30 ENCOUNTER — Ambulatory Visit (INDEPENDENT_AMBULATORY_CARE_PROVIDER_SITE_OTHER): Payer: BLUE CROSS/BLUE SHIELD | Admitting: Internal Medicine

## 2016-09-30 DIAGNOSIS — R040 Epistaxis: Secondary | ICD-10-CM | POA: Diagnosis not present

## 2016-09-30 MED ORDER — FLUTICASONE PROPIONATE 50 MCG/ACT NA SUSP: 2.0000 | Freq: Every day | NASAL | 6 refills | Status: DC

## 2016-09-30 NOTE — Patient Instructions (Addendum)
We have sent in the nose spray for the allergies called flonase. Take 2 puffs in each nostril once a day.   Use vaseline on the nose twice a day to help moisturize the skin for the next couple of weeks.   It is okay to use affrin to stop a nosebleed.    Nosebleed, Adult A nosebleed is when blood comes out of the nose. Nosebleeds are common. Usually, they are not a sign of a serious condition. Nosebleeds can happen if a small blood vessel in your nose starts to bleed or if the lining of your nose (mucous membrane) cracks. They are commonly caused by:  Allergies.  Colds.  Picking your nose.  Blowing your nose too hard.  An injury from sticking an object into your nose or getting hit in the nose.  Dry or cold air. Less common causes of nosebleeds include:  Toxic fumes.  Something abnormal in the nose or in the air-filled spaces in the bones of the face (sinuses).  Growths in the nose, such as polyps.  Medicines or conditions that cause blood to clot slowly.  Certain illnesses or procedures that irritate or dry out the nasal passages. Follow these instructions at home: When you have a nosebleed:   Sit down and tilt your head slightly forward.  Use a clean towel or tissue to pinch your nostrils under the bony part of your nose. After 10 minutes, let go of your nose and see if bleeding starts again. Do not release pressure before that time. If there is still bleeding, repeat the pinching and holding for 10 minutes until the bleeding stops.  Do not place tissues or gauze in the nose to stop bleeding.  Avoid lying down and avoid tilting your head backward. That may make blood collect in the throat and cause gagging or coughing.  Use a nasal spray decongestant to help with a nosebleed as told by your health care provider.  Do not use petroleum jelly or mineral oil in your nose. It can drip into your lungs. After a nosebleed:   Avoid blowing your nose or sniffing for a number  of hours.  Avoid straining, lifting, or bending at the waist for several days. You may resume other normal activities as you are able.  Use saline spray or a humidifier as told by your health care provider.  Aspirinand blood thinners make bleeding more likely. If you are prescribed these medicines and you suffer from nosebleeds:  Ask your health care provider if you should stop taking the medicines or if you should adjust the dose.  Do not stop taking medicines that your health care provider has recommended unless told by your health care provider.  If your nosebleed was caused by dry mucous membranes, use over-the-counter saline nasal spray or gel. This will keep the mucous membranes moist and allow them to heal. If you must use a lubricant:  Choose one that is water-soluble.  Use only as much as you need and use it only as often as needed.  Do not lie down until several hours after you use it. Contact a health care provider if:  You have a fever.  You get nosebleeds often or more often than usual.  You bruise very easily.  You have a nosebleed from having something stuck in your nose.  You have bleeding in your mouth.  You vomit or cough up brown material.  You have a nosebleed after you start a new medicine. Get help right  away if:  You have a nosebleed after a fall or a head injury.  Your nosebleed does not go away after 20 minutes.  You feel dizzy or weak.  You have unusual bleeding from other parts of your body.  You have unusual bruising on other parts of your body.  You become sweaty.  You vomit blood. This information is not intended to replace advice given to you by your health care provider. Make sure you discuss any questions you have with your health care provider. Document Released: 03/05/2005 Document Revised: 01/24/2016 Document Reviewed: 12/11/2015 Elsevier Interactive Patient Education  2017 [employer].

## 2016-09-30 NOTE — Progress Notes (Signed)
Pre visit review using our clinic review tool, if applicable. No additional management support is needed unless otherwise documented below in the visit note. ° °

## 2016-09-30 NOTE — Progress Notes (Signed)
° °  Subjective:    Patient ID: Lauren Olson, female    DOB: 1970-07-15, 46 y.o.   MRN: [id number]  HPI The patient is a 46 YO female coming in for nosebleeds. She typically gets these with the weather changes and pollen. She is not taking anything for allergies right now. She is getting some bleeding which she is able to stop within minutes. She is blowing her nose often and usually tries to remove the clot as it blocks her nose and makes her feel like she cannot breathe. She has used flonase in the past which she felt was helpful with decreased nose bleeding.   Review of Systems  Constitutional: Negative for activity change, appetite change, fatigue and fever.  HENT: Positive for congestion, nosebleeds, postnasal drip and rhinorrhea. Negative for ear discharge, ear pain, sinus pain, sinus pressure, tinnitus, trouble swallowing and voice change.   Eyes: Negative.   Respiratory: Negative.   Cardiovascular: Negative.   Gastrointestinal: Negative.   Neurological: Negative.       Objective:   Physical Exam  Constitutional: She appears well-developed and well-nourished.  HENT:  Head: Normocephalic and atraumatic.  Right Ear: External ear normal.  Left Ear: External ear normal.  Raw skin in the right turbinate without obvious bleeding vessel  Eyes: EOM are normal.  Neck: Normal range of motion.  Cardiovascular: Normal rate and regular rhythm.   Pulmonary/Chest: Effort normal. No respiratory distress. She has no wheezes.  Abdominal: Soft.  Skin: Skin is warm and dry.   Vitals:   09/30/16 0852  BP: 110/70  Pulse: 80  Resp: 12  Temp: 99.4 F (37.4 C)  TempSrc: Oral  SpO2: 99%  Weight: 146 lb (66.2 kg)  Height:  (1.651 m)      Assessment & Plan:

## 2016-09-30 NOTE — Assessment & Plan Note (Signed)
Rx for flonase per patient request, advised to not remove clots after bleeding to avoid rebleeding. Advised humidifier in room at night and use vaseline on the nostrils to help with moisture. No picking or excessive blowing.

## 2016-10-16 DIAGNOSIS — N760 Acute vaginitis: Secondary | ICD-10-CM | POA: Diagnosis not present

## 2016-10-16 DIAGNOSIS — Z630 Problems in relationship with spouse or partner: Secondary | ICD-10-CM | POA: Diagnosis not present

## 2016-11-25 DIAGNOSIS — Z01419 Encounter for gynecological examination (general) (routine) without abnormal findings: Secondary | ICD-10-CM | POA: Diagnosis not present

## 2017-01-12 ENCOUNTER — Other Ambulatory Visit: Payer: Self-pay | Admitting: Obstetrics and Gynecology

## 2017-01-12 DIAGNOSIS — Z1231 Encounter for screening mammogram for malignant neoplasm of breast: Secondary | ICD-10-CM

## 2017-02-05 ENCOUNTER — Ambulatory Visit (INDEPENDENT_AMBULATORY_CARE_PROVIDER_SITE_OTHER): Payer: BLUE CROSS/BLUE SHIELD | Admitting: Nurse Practitioner

## 2017-02-05 ENCOUNTER — Encounter: Payer: Self-pay | Admitting: Nurse Practitioner

## 2017-02-05 VITALS — BP 118/68 | HR 79 | Temp 98.6°F | Ht 65.0 in | Wt 137.0 lb

## 2017-02-05 DIAGNOSIS — G44019 Episodic cluster headache, not intractable: Secondary | ICD-10-CM | POA: Diagnosis not present

## 2017-02-05 DIAGNOSIS — L209 Atopic dermatitis, unspecified: Secondary | ICD-10-CM | POA: Diagnosis not present

## 2017-02-05 MED ORDER — BUTALBITAL-ASPIRIN-CAFFEINE 50-325-40 MG PO TABS: 1.0000 | ORAL_TABLET | Freq: Two times a day (BID) | ORAL | 0 refills | Status: DC | PRN

## 2017-02-05 MED ORDER — METHOCARBAMOL 500 MG PO TABS: 500.0000 mg | ORAL_TABLET | Freq: Three times a day (TID) | ORAL | 0 refills | Status: DC | PRN

## 2017-02-05 MED ORDER — MOMETASONE FUROATE 0.1 % EX CREA: 1.0000 "application " | TOPICAL_CREAM | Freq: Every day | CUTANEOUS | 1 refills | Status: DC

## 2017-02-05 NOTE — Patient Instructions (Signed)
Cluster Headache A cluster headache is a type of headache that causes deep, intense head pain. Cluster headaches can last from 15 minutes to 3 hours. They usually occur:  On one side of the head. They may occur on the other side when a new cluster of headaches begins.  Repeatedly over weeks to months.  Several times a day.  At the same time of day, often at night.  More often in the fall and springtime.  What are the causes? The cause of this condition is not known. What increases the risk? This condition is more likely to develop in:  Males.  People who drink alcohol.  People who smoke or use products that contain nicotine or tobacco.  People who take medicines that cause blood vessels to expand, such as nitroglycerin.  People who take antihistamines.  What are the signs or symptoms? Symptoms of this condition include:  Severe pain on one side of the head that begins behind or around your eye or temple.  Pain on one side of the head.  Nausea.  Sensitivity to light.  Runny nose and nasal stuffiness.  Sweaty, pale skin on the face.  Droopy or swollen eyelid, eye redness, or tearing.  Restlessness and agitation.  How is this diagnosed? This condition may be diagnosed based on:  Your symptoms.  A physical exam.  Your health care provider may order tests to see if your headaches are caused by another medical condition. These tests may show that you do not have cluster headaches. Tests may include:  A CT scan of your head.  An MRI of your head.  Lab tests.  How is this treated? This condition may be treated with:  Medicines to relieve pain and to prevent repeated (recurrent) attacks. Some people may need a combination of medicines.  Oxygen. This helps to relieve pain.  Follow these instructions at home: Headache diary Keep a headache diary as told by your health care provider. Doing this can help you and your health care provider figure out what  triggers your headaches. In your headache diary, include information about:  The time of day that your headache started and what you were doing when it began.  How long your headache lasted.  Where your pain started and whether it moved to other areas.  The type of pain, such as burning, stabbing, throbbing, or cramping.  Your level of pain. Use a pain scale and rate the pain with a number from 1 (mild) up to 10 (severe).  The treatment that you used, and any change in symptoms after treatment.  Medicines  Take over-the-counter and prescription medicines only as told by your health care provider.  Do not drive or use heavy machinery while taking prescription pain medicine.  Use oxygen as told by your health care provider. Lifestyle  Follow a regular sleep schedule. Do not vary the time that you go to bed or the amount that you sleep from day to day. It is important to stay on the same schedule during a cluster period to help prevent headaches.  Exercise regularly.  Eat a healthy diet and avoid foods that may trigger your headaches.  Avoid alcohol.  Do not use any products that contain nicotine or tobacco, such as cigarettes and e-cigarettes. If you need help quitting, ask your health care provider. Contact a health care provider if:  Your headaches change, become more severe, or occur more often.  The medicine or oxygen that your health care provider recommended does  not help. Get help right away if:  You faint.  You have weakness or numbness, especially on one side of your body or face.  You have double vision.  You have nausea or vomiting that does not go away within several hours.  You have trouble talking, walking, or keeping your balance.  You have pain or stiffness in your neck.  You have a fever. Summary  A cluster headache is a type of headache that causes deep, intense head pain, usually on one side of the head.  Keep a headache diary to help discover  what triggers your headaches.  A regular sleep schedule can help prevent headaches. This information is not intended to replace advice given to you by your health care provider. Make sure you discuss any questions you have with your health care provider. Document Released: 05/26/2005 Document Revised: 02/05/2016 Document Reviewed: 02/05/2016 Elsevier Interactive Patient Education  [employer].

## 2017-02-05 NOTE — Progress Notes (Signed)
Subjective:  Patient ID: Lauren Olson, female    DOB: [birth year]  Age: 46 y.o. MRN: [id number]  CC: Migraine (x 3 months, using BC powder and excedrine migraine and they are not helping)   HPI Headache: Onset 3months ago triggered by stress at home. No improvement with BC powder and excedrin with minimal relief. Located on right occipital region, tightness, right eye tearing. Associated with dizziness.  Outpatient Medications Prior to Visit  Medication Sig Dispense Refill   fluticasone (FLONASE) 50 MCG/ACT nasal spray Place 2 sprays into both nostrils daily. 16 g 6   No facility-administered medications prior to visit.     ROS See HPI  Objective:  BP 118/68 (BP Location: Left Arm, Patient Position: Sitting, Cuff Size: Normal)    Pulse 79    Temp 98.6 F (37 C) (Oral)    Ht 5\' 5"  (1.651 m)    Wt 137 lb (62.1 kg)    LMP 05/19/2016 (Exact Date)    SpO2 99%    BMI 22.80 kg/m   BP Readings from Last 3 Encounters:  02/05/17 118/68  09/30/16 110/70  07/08/16 108/62    Wt Readings from Last 3 Encounters:  02/05/17 137 lb (62.1 kg)  09/30/16 146 lb (66.2 kg)  07/08/16 141 lb (64 kg)    Physical Exam  Constitutional: She is oriented to person, place, and time. No distress.  HENT:  Right Ear: Tympanic membrane, external ear and ear canal normal.  Left Ear: Tympanic membrane, external ear and ear canal normal.  Nose: Nose normal. Right sinus exhibits no maxillary sinus tenderness and no frontal sinus tenderness. Left sinus exhibits no maxillary sinus tenderness and no frontal sinus tenderness.  Mouth/Throat: Oropharynx is clear and moist. No oropharyngeal exudate.  Eyes: Pupils are equal, round, and reactive to light. Conjunctivae and EOM are normal. No scleral icterus.  Neck: Normal range of motion. Neck supple. No thyromegaly present.  Cardiovascular: Normal rate, regular rhythm and normal heart sounds.   Pulmonary/Chest: Effort normal and breath sounds normal. No  respiratory distress.  Abdominal: Soft. She exhibits no distension.  Musculoskeletal: Normal range of motion. She exhibits no edema.  Lymphadenopathy:    She has no cervical adenopathy.  Neurological: She is alert and oriented to person, place, and time. No cranial nerve deficit.  Skin: Skin is warm and dry. Rash noted. Rash is macular.  Scaly rash on bilateral ear canal, no erythema, no drainage.  Psychiatric: She has a normal mood and affect. Her behavior is normal.    Lab Results  Component Value Date   WBC 5.7 03/19/2016   HGB 9.6 (L) 03/19/2016   HCT 30.5 (L) 03/19/2016   PLT 308.0 03/19/2016   GLUCOSE 86 03/19/2016   CHOL 154 03/19/2016   TRIG 63.0 03/19/2016   HDL 59.90 03/19/2016   LDLCALC 82 03/19/2016   ALT 8 03/19/2016   AST 21 03/19/2016   NA 139 03/19/2016   K 3.9 03/19/2016   CL 105 03/19/2016   CREATININE 0.69 03/19/2016   BUN 10 03/19/2016   CO2 26 03/19/2016    Mm Digital Screening Bilateral  Addendum Date: 04/07/2016   ADDENDUM REPORT: 04/07/2016 15:23 ADDENDUM: Prior studies have become available for comparison. There has been no change since the prior exams. No change to the original impression, recommendation or BI-RADS category. Electronically Signed   By: Amie Portland M.D.   On: 04/07/2016 15:23   Result Date: 02/26/2016 CLINICAL DATA:  Screening. EXAM: DIGITAL SCREENING BILATERAL MAMMOGRAM WITH  CAD COMPARISON:  None. ACR Breast Density Category c: The breast tissue is heterogeneously dense, which may obscure small masses FINDINGS: There are no findings suspicious for malignancy. Images were processed with CAD. IMPRESSION: No mammographic evidence of malignancy. A result letter of this screening mammogram will be mailed directly to the patient. RECOMMENDATION: Screening mammogram in one year. (Code:SM-B-01Y) BI-RADS CATEGORY  1: Negative. Electronically Signed: By: Amie Portlandavid  Ormond M.D. On: 03/21/2016 08:35    Assessment & Plan:   Aneta Minsyraine was seen today  for migraine.  Diagnoses and all orders for this visit:  Episodic cluster headache, not intractable -     methocarbamol (ROBAXIN) 500 MG tablet; Take 1 tablet (500 mg total) by mouth every 8 (eight) hours as needed for muscle spasms. -     butalbital-aspirin-caffeine (FIORINAL) 50-325-40 MG tablet; Take 1 tablet by mouth 2 (two) times daily as needed for headache.  Atopic dermatitis, unspecified type -     mometasone (ELOCON) 0.1 % cream; Apply 1 application topically daily.   I am having Ms. Chapman start on methocarbamol, butalbital-aspirin-caffeine, and mometasone. I am also having her maintain her fluticasone.  Meds ordered this encounter  Medications   methocarbamol (ROBAXIN) 500 MG tablet    Sig: Take 1 tablet (500 mg total) by mouth every 8 (eight) hours as needed for muscle spasms.    Dispense:  21 tablet    Refill:  0    Order Specific Question:   Supervising Provider    Answer:   Tresa GarterPLOTNIKOV, ALEKSEI V [[id number]]   butalbital-aspirin-caffeine (FIORINAL) 50-325-40 MG tablet    Sig: Take 1 tablet by mouth 2 (two) times daily as needed for headache.    Dispense:  14 tablet    Refill:  0    Order Specific Question:   Supervising Provider    Answer:   Tresa GarterPLOTNIKOV, ALEKSEI V [[id number]]   mometasone (ELOCON) 0.1 % cream    Sig: Apply 1 application topically daily.    Dispense:  15 g    Refill:  1    Order Specific Question:   Supervising Provider    Answer:   Tresa GarterPLOTNIKOV, ALEKSEI V [[id number]]    Follow-up: Return if symptoms worsen or fail to improve.  Alysia Pennaharlotte Shenice Dolder, NP

## 2017-02-10 DIAGNOSIS — F4323 Adjustment disorder with mixed anxiety and depressed mood: Secondary | ICD-10-CM | POA: Diagnosis not present

## 2017-02-12 ENCOUNTER — Encounter: Payer: Self-pay | Admitting: Nurse Practitioner

## 2017-02-12 ENCOUNTER — Ambulatory Visit (INDEPENDENT_AMBULATORY_CARE_PROVIDER_SITE_OTHER): Payer: BLUE CROSS/BLUE SHIELD | Admitting: Nurse Practitioner

## 2017-02-12 VITALS — BP 118/66 | HR 79 | Temp 98.1°F | Ht 65.0 in | Wt 141.0 lb

## 2017-02-12 DIAGNOSIS — F4323 Adjustment disorder with mixed anxiety and depressed mood: Secondary | ICD-10-CM

## 2017-02-12 MED ORDER — CITALOPRAM HYDROBROMIDE 20 MG PO TABS: 20.0000 mg | ORAL_TABLET | Freq: Every day | ORAL | 0 refills | Status: DC

## 2017-02-12 MED ORDER — CLONAZEPAM 0.5 MG PO TABS: 0.2500 mg | ORAL_TABLET | Freq: Two times a day (BID) | ORAL | 0 refills | Status: DC | PRN

## 2017-02-12 NOTE — Patient Instructions (Addendum)
I instructed pt to start 1/2 tablet once daily for 1 week and then increase to a full tablet once daily on week two as tolerated.    We discussed common side effects such as nausea, drowsiness and weight gain.  Also discussed rare but serious side effect of suicide ideation.  She is instructed to discontinue medication go directly to ED if this occurs.  Pt verbalizes understanding.    Plan follow up in 1weeks to evaluate progress.   Adjustment Disorder, Adult Adjustment disorder is a group of symptoms that can develop after a stressful life event, such as the loss of a job or serious physical illness. The symptoms can affect how you feel, think, and act. They may interfere with your relationships. Adjustment disorder increases your risk of suicide and substance abuse. If this disorder is not managed early, it can develop into a more serious condition, such as major depressive disorder or post-traumatic stress disorder. What are the causes? This condition happens when you have trouble recovering from or coping with a stressful life event. What increases the risk? You are more likely to develop this condition if:  You have had depression or anxiety.  You are being treated for a long-term (chronic) illness.  You are being treated for an illness that cannot be cured (terminal illness).  You have a family history of mental illness.  What are the signs or symptoms? Symptoms of this condition include:  Extreme trouble doing daily tasks, such as going to work.  Sadness, depression, or crying spells.  Worrying a lot.  Loss of enjoyment.  Change in appetite or weight.  Feelings of loss or hopelessness.  Thoughts of suicide.  Anxiety, worry, or nervousness.  Trouble sleeping.  Avoiding family and friends.  Fighting or vandalism.  Complaining of feeling sick without being ill.  Feeling dazed or disconnected.  Nightmares.  Trouble sleeping.  Irritability.  Reckless  driving.  Poor work International aid/development workerperformance.  Ignoring bills.  Symptoms of this condition start within three months of the stressful event. They do not last more than six months, unless the stressful circumstances last longer. Normal grieving after the death of a loved one is not a symptom of this condition. How is this diagnosed? To diagnose this condition, your health care provider will ask about what has happened in your life and how it has affected you. He or she may also ask about your medical history and your use of medicines, alcohol, and other substances. Your health care provider may do a physical exam and order lab tests or other studies. You may be referred to a mental health specialist. How is this treated? Treatment options for this condition include:  Counseling or talk therapy. Talk therapy is usually provided by mental health specialists.  Medicines. Certain medicines may help with depression, anxiety, and sleep.  Support groups. These offer emotional support, advice, and guidance. They are made up of people who have had similar experiences.  Observation and time. This is sometimes called "watchful waiting." In this treatment, health care providers monitor your health and behavior without other treatment. Adjustment disorder sometimes gets better on its own with time.  Follow these instructions at home:  Take over-the-counter and prescription medicines only as told by your health care provider.  Keep all follow-up visits as told by your health care provider. This is important. Contact a health care provider if:  Your symptoms do not improve in six months.  Your symptoms get worse. Get help right away if:  You have serious thoughts about hurting yourself or someone else. If you ever feel like you may hurt yourself or others, or have thoughts about taking your own life, get help right away. You can go to your nearest emergency department or call:  Your local emergency services  (911 in the [place]  A suicide crisis helpline, such as the National Suicide Prevention Lifeline at (718)614-4023. This is open 24 hours a day.  Summary  Adjustment disorder is a group of symptoms that can develop after a stressful life event, such as the loss of a job or serious physical illness. The symptoms can affect how you feel, think, and act. They may interfere with your relationships.  Symptoms of this condition start within three months of the stressful event. They do not last more than six months, unless the stressful circumstances last longer.  Treatment may include talk therapy, medicines, participation in a support group, or observation to see if symptoms improve.  Contact your health care provider if your symptoms get worse or do not improve in six months.  If you ever feel like you may hurt yourself or others, or have thoughts about taking your own life, get help right away. This information is not intended to replace advice given to you by your health care provider. Make sure you discuss any questions you have with your health care provider. Document Released: 01/28/2006 Document Revised: 07/25/2016 Document Reviewed: 07/25/2016 Elsevier Interactive Patient Education  [employer].
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
## 2017-02-17 DIAGNOSIS — F4323 Adjustment disorder with mixed anxiety and depressed mood: Secondary | ICD-10-CM | POA: Diagnosis not present

## 2017-02-19 ENCOUNTER — Encounter: Payer: Self-pay | Admitting: Nurse Practitioner

## 2017-02-19 ENCOUNTER — Ambulatory Visit (INDEPENDENT_AMBULATORY_CARE_PROVIDER_SITE_OTHER): Payer: BLUE CROSS/BLUE SHIELD | Admitting: Nurse Practitioner

## 2017-02-19 VITALS — BP 100/72 | HR 75 | Temp 98.0°F | Ht 65.0 in | Wt 141.0 lb

## 2017-02-19 DIAGNOSIS — G44019 Episodic cluster headache, not intractable: Secondary | ICD-10-CM

## 2017-02-19 DIAGNOSIS — L209 Atopic dermatitis, unspecified: Secondary | ICD-10-CM

## 2017-02-19 DIAGNOSIS — F4323 Adjustment disorder with mixed anxiety and depressed mood: Secondary | ICD-10-CM | POA: Diagnosis not present

## 2017-02-19 DIAGNOSIS — M9903 Segmental and somatic dysfunction of lumbar region: Secondary | ICD-10-CM | POA: Diagnosis not present

## 2017-02-19 DIAGNOSIS — M9902 Segmental and somatic dysfunction of thoracic region: Secondary | ICD-10-CM | POA: Diagnosis not present

## 2017-02-19 DIAGNOSIS — M9905 Segmental and somatic dysfunction of pelvic region: Secondary | ICD-10-CM | POA: Diagnosis not present

## 2017-02-19 DIAGNOSIS — M5386 Other specified dorsopathies, lumbar region: Secondary | ICD-10-CM | POA: Diagnosis not present

## 2017-02-19 MED ORDER — BUTALBITAL-ASPIRIN-CAFFEINE 50-325-40 MG PO TABS: 1.0000 | ORAL_TABLET | Freq: Two times a day (BID) | ORAL | 0 refills | Status: DC | PRN

## 2017-02-19 MED ORDER — CITALOPRAM HYDROBROMIDE 20 MG PO TABS: 20.0000 mg | ORAL_TABLET | Freq: Every day | ORAL | 5 refills | Status: DC

## 2017-02-19 MED ORDER — TRIAMCINOLONE ACETONIDE 0.5 % EX CREA: 1.0000 "application " | TOPICAL_CREAM | Freq: Two times a day (BID) | CUTANEOUS | 1 refills | Status: DC

## 2017-02-19 NOTE — Progress Notes (Signed)
Subjective:  Patient ID: Lauren Olson, female    DOB: [birth year]  Age: 46 y.o. MRN: [id number]  CC: Follow-up (follow up/getting better: med consult: triamcinolond acetonide 0.5 or 1%. preventative paper work to fill: had pap on 11/2016 and mammogram schedule 9/)   Anxiety  Presents for follow-up visit. Symptoms include decreased concentration, depressed mood, excessive worry, insomnia, muscle tension, nervous/anxious behavior, restlessness and suicidal ideas. Patient reports no chest pain, compulsions, confusion, dizziness, dry mouth, feeling of choking, hyperventilation, irritability, malaise, nausea, obsessions, palpitations, panic or shortness of breath. Symptoms occur most days. The severity of symptoms is causing significant distress. The quality of sleep is good (with use of klonopin and celexa). Nighttime awakenings: none.   Compliance with medications is 76-100%. Treatment side effects: denies any adverse side effects.   Persistent suicidal ideation with no plan.  No personal or family hx of depression or anxiety or any psychological disorder.  Improved sleep with klonopin and citalopram. Has been using klonopin once a day.  Needs steroid cream changed to triamcinolone.  Outpatient Medications Prior to Visit  Medication Sig Dispense Refill   clonazePAM (KLONOPIN) 0.5 MG tablet Take 0.5-1 tablets (0.25-0.5 mg total) by mouth 2 (two) times daily as needed for anxiety. 14 tablet 0   fluticasone (FLONASE) 50 MCG/ACT nasal spray Place 2 sprays into both nostrils daily. 16 g 6   methocarbamol (ROBAXIN) 500 MG tablet Take 1 tablet (500 mg total) by mouth every 8 (eight) hours as needed for muscle spasms. 21 tablet 0   butalbital-aspirin-caffeine (FIORINAL) 50-325-40 MG tablet Take 1 tablet by mouth 2 (two) times daily as needed for headache. 14 tablet 0   citalopram (CELEXA) 20 MG tablet Take 1 tablet (20 mg total) by mouth daily. 30 tablet 0   mometasone (ELOCON) 0.1 % cream  Apply 1 application topically daily. (Patient not taking: Reported on 02/19/2017) 15 g 1   No facility-administered medications prior to visit.     ROS See HPI  Objective:  BP 100/72    Pulse 75    Temp 98 F (36.7 C)    Ht  (1.651 m)    Wt 141 lb (64 kg)    SpO2 98%    BMI 23.46 kg/m   BP Readings from Last 3 Encounters:  02/19/17 100/72  02/12/17 118/66  02/05/17 118/68    Wt Readings from Last 3 Encounters:  02/19/17 141 lb (64 kg)  02/12/17 141 lb (64 kg)  02/05/17 137 lb (62.1 kg)    Physical Exam  Constitutional: She is oriented to person, place, and time. No distress.  Cardiovascular: Normal rate.   Pulmonary/Chest: Effort normal.  Musculoskeletal: Normal range of motion.  Neurological: She is alert and oriented to person, place, and time.  Psychiatric: She has a normal mood and affect. Her behavior is normal. Thought content normal.  Vitals reviewed.   Lab Results  Component Value Date   WBC 5.7 03/19/2016   HGB 9.6 (L) 03/19/2016   HCT 30.5 (L) 03/19/2016   PLT 308.0 03/19/2016   GLUCOSE 86 03/19/2016   CHOL 154 03/19/2016   TRIG 63.0 03/19/2016   HDL 59.90 03/19/2016   LDLCALC 82 03/19/2016   ALT 8 03/19/2016   AST 21 03/19/2016   NA 139 03/19/2016   K 3.9 03/19/2016   CL 105 03/19/2016   CREATININE 0.69 03/19/2016   BUN 10 03/19/2016   CO2 26 03/19/2016    Mm Digital Screening Bilateral  Addendum Date: 04/07/2016  ADDENDUM REPORT: 04/07/2016 15:23 ADDENDUM: Prior studies have become available for comparison. There has been no change since the prior exams. No change to the original impression, recommendation or BI-RADS category. Electronically Signed   By: Amie Portlandavid  Ormond M.D.   On: 04/07/2016 15:23   Result Date: 02/26/2016 CLINICAL DATA:  Screening. EXAM: DIGITAL SCREENING BILATERAL MAMMOGRAM WITH CAD COMPARISON:  None. ACR Breast Density Category c: The breast tissue is heterogeneously dense, which may obscure small masses FINDINGS: There  are no findings suspicious for malignancy. Images were processed with CAD. IMPRESSION: No mammographic evidence of malignancy. A result letter of this screening mammogram will be mailed directly to the patient. RECOMMENDATION: Screening mammogram in one year. (Code:[id number]-B-01Y) BI-RADS CATEGORY  1: Negative. Electronically Signed: By: Amie Portlandavid  Ormond M.D. On: 03/21/2016 08:35    Assessment & Plan:   Lauren Olson was seen today for follow-up.  Diagnoses and all orders for this visit:  Adjustment disorder with mixed anxiety and depressed mood -     citalopram (CELEXA) 20 MG tablet; Take 1 tablet (20 mg total) by mouth daily.  Episodic cluster headache, not intractable -     butalbital-aspirin-caffeine (FIORINAL) 50-325-40 MG tablet; Take 1 tablet by mouth 2 (two) times daily as needed for headache.   I am having Ms. Lauren Olson maintain her fluticasone, methocarbamol, mometasone, clonazePAM, butalbital-aspirin-caffeine, and citalopram.  Meds ordered this encounter  Medications   butalbital-aspirin-caffeine (FIORINAL) 50-325-40 MG tablet    Sig: Take 1 tablet by mouth 2 (two) times daily as needed for headache.    Dispense:  14 tablet    Refill:  0    Order Specific Question:   Supervising Provider    Answer:   Tresa GarterPLOTNIKOV, ALEKSEI V [[id number]]   citalopram (CELEXA) 20 MG tablet    Sig: Take 1 tablet (20 mg total) by mouth daily.    Dispense:  30 tablet    Refill:  5    Order Specific Question:   Supervising Provider    Answer:   Tresa GarterPLOTNIKOV, ALEKSEI V [[id number]]   Follow-up: Return in about 4 weeks (around 03/19/2017) for CPE and depression (fasting).  Lauren Pennaharlotte Gianpaolo Mindel, NP

## 2017-02-19 NOTE — Patient Instructions (Addendum)
Try to use klonopin (clonazepam) as needed only.  Since you have been taking medication once daily for sleep, try to wean off: take half tab once a day x 1week, then half tab as needed till complete.  Continue celexa (citalopram) once a day continuously.  Continue sessions with psychology.  Return with physical form to be complete after upcoming CPE.

## 2017-03-03 ENCOUNTER — Ambulatory Visit
Admission: RE | Admit: 2017-03-03 | Discharge: 2017-03-03 | Disposition: A | Discharge status: Home / Self Care | Payer: BLUE CROSS/BLUE SHIELD | Source: Ambulatory Visit | Attending: Obstetrics and Gynecology | Admitting: Obstetrics and Gynecology

## 2017-03-03 ENCOUNTER — Encounter (INDEPENDENT_AMBULATORY_CARE_PROVIDER_SITE_OTHER): Payer: Self-pay

## 2017-03-03 DIAGNOSIS — Z1231 Encounter for screening mammogram for malignant neoplasm of breast: Secondary | ICD-10-CM

## 2017-03-03 DIAGNOSIS — F4323 Adjustment disorder with mixed anxiety and depressed mood: Secondary | ICD-10-CM | POA: Diagnosis not present

## 2017-03-04 ENCOUNTER — Other Ambulatory Visit: Payer: Self-pay | Admitting: Obstetrics and Gynecology

## 2017-03-04 DIAGNOSIS — R928 Other abnormal and inconclusive findings on diagnostic imaging of breast: Secondary | ICD-10-CM

## 2017-03-10 ENCOUNTER — Ambulatory Visit
Admission: RE | Admit: 2017-03-10 | Discharge: 2017-03-10 | Disposition: A | Discharge status: Home / Self Care | Payer: BLUE CROSS/BLUE SHIELD | Source: Ambulatory Visit | Attending: Obstetrics and Gynecology | Admitting: Obstetrics and Gynecology

## 2017-03-10 ENCOUNTER — Ambulatory Visit: Payer: BLUE CROSS/BLUE SHIELD

## 2017-03-10 DIAGNOSIS — R928 Other abnormal and inconclusive findings on diagnostic imaging of breast: Secondary | ICD-10-CM

## 2017-03-10 DIAGNOSIS — R922 Inconclusive mammogram: Secondary | ICD-10-CM | POA: Diagnosis not present

## 2017-03-24 DIAGNOSIS — F4323 Adjustment disorder with mixed anxiety and depressed mood: Secondary | ICD-10-CM | POA: Diagnosis not present

## 2017-04-07 DIAGNOSIS — F4323 Adjustment disorder with mixed anxiety and depressed mood: Secondary | ICD-10-CM | POA: Diagnosis not present

## 2017-04-20 ENCOUNTER — Encounter: Payer: BLUE CROSS/BLUE SHIELD | Admitting: Internal Medicine

## 2017-04-21 DIAGNOSIS — F4323 Adjustment disorder with mixed anxiety and depressed mood: Secondary | ICD-10-CM | POA: Diagnosis not present

## 2017-05-11 ENCOUNTER — Ambulatory Visit (INDEPENDENT_AMBULATORY_CARE_PROVIDER_SITE_OTHER): Payer: BLUE CROSS/BLUE SHIELD | Admitting: Internal Medicine

## 2017-05-11 ENCOUNTER — Other Ambulatory Visit (INDEPENDENT_AMBULATORY_CARE_PROVIDER_SITE_OTHER): Payer: BLUE CROSS/BLUE SHIELD

## 2017-05-11 ENCOUNTER — Encounter: Payer: Self-pay | Admitting: Internal Medicine

## 2017-05-11 VITALS — BP 118/82 | HR 74 | Temp 98.4°F | Ht 65.0 in | Wt 145.0 lb

## 2017-05-11 DIAGNOSIS — Z0000 Encounter for general adult medical examination without abnormal findings: Secondary | ICD-10-CM

## 2017-05-11 DIAGNOSIS — F4323 Adjustment disorder with mixed anxiety and depressed mood: Secondary | ICD-10-CM | POA: Diagnosis not present

## 2017-05-11 LAB — LIPID PANEL
CHOL/HDL RATIO: 2
CHOLESTEROL: 147 mg/dL (ref 0–200)
HDL: 65.7 mg/dL (ref >39.00)
LDL CALC: 75 mg/dL (ref 0–99)
NonHDL: 81.45
TRIGLYCERIDES: 31 mg/dL (ref 0.0–149.0)
VLDL: 6.2 mg/dL (ref 0.0–40.0)

## 2017-05-11 LAB — COMPREHENSIVE METABOLIC PANEL
ALBUMIN: 4.4 g/dL (ref 3.5–5.2)
ALT: 12 U/L (ref 0–35)
AST: 20 U/L (ref 0–37)
Alkaline Phosphatase: 72 U/L (ref 39–117)
BUN: 6 mg/dL (ref 6–23)
CHLORIDE: 101 meq/L (ref 96–112)
CO2: 29 meq/L (ref 19–32)
Calcium: 10 mg/dL (ref 8.4–10.5)
Creatinine, Ser: 0.71 mg/dL (ref 0.40–1.20)
GFR: 113.38 mL/min (ref >60.00)
Glucose, Bld: 78 mg/dL (ref 70–99)
Potassium: 3.9 mEq/L (ref 3.5–5.1)
SODIUM: 138 meq/L (ref 135–145)
Total Bilirubin: 0.3 mg/dL (ref 0.2–1.2)
Total Protein: 8 g/dL (ref 6.0–8.3)

## 2017-05-11 LAB — CBC
HCT: 36.8 % (ref 36.0–46.0)
Hemoglobin: 11.8 g/dL — ABNORMAL LOW (ref 12.0–15.0)
MCHC: 32.2 g/dL (ref 30.0–36.0)
MCV: 86.3 fl (ref 78.0–100.0)
Platelets: 298 10*3/uL (ref 150.0–400.0)
RBC: 4.26 Mil/uL (ref 3.87–5.11)
RDW: 15.5 % (ref 11.5–15.5)
WBC: 5.5 10*3/uL (ref 4.0–10.5)

## 2017-05-11 LAB — HIV ANTIBODY (ROUTINE TESTING W REFLEX): HIV: NONREACTIVE

## 2017-05-11 MED ORDER — TRAZODONE HCL 50 MG PO TABS: 25.0000 mg | ORAL_TABLET | Freq: Every evening | ORAL | 3 refills | Status: DC | PRN

## 2017-05-11 MED ORDER — MELOXICAM 15 MG PO TABS: 15.0000 mg | ORAL_TABLET | Freq: Every day | ORAL | 0 refills | Status: DC

## 2017-05-11 NOTE — Assessment & Plan Note (Signed)
Checking labs, declines flu. Tetanus up to date. Pap and mammogram with gyn. Counseled about sun safety and mole surveillance. Given screening recommendations.

## 2017-05-11 NOTE — Assessment & Plan Note (Signed)
She is off klonopin and will substitute trazodone for sleep. Continue celexa and counseling. Suspect acute with divorce and should resolve once that is finalized.

## 2017-05-11 NOTE — Progress Notes (Signed)
° °  Subjective:    Patient ID: Lauren Olson, female    DOB: [birth year], 46 y.o.   MRN: [id number]  HPI The patient is a 46 YO female coming in for physical.   [facility], social history reviewed and updated.  Review of Systems  Constitutional: Negative.   HENT: Negative.   Eyes: Negative.   Respiratory: Negative for cough, chest tightness and shortness of breath.   Cardiovascular: Negative for chest pain, palpitations and leg swelling.  Gastrointestinal: Negative for abdominal distention, abdominal pain, constipation, diarrhea, nausea and vomiting.  Musculoskeletal: Negative.   Skin: Negative.   Neurological: Negative.   Psychiatric/Behavioral: Positive for decreased concentration, dysphoric mood and sleep disturbance. The patient is nervous/anxious.       Objective:   Physical Exam  Constitutional: She is oriented to person, place, and time. She appears well-developed and well-nourished.  HENT:  Head: Normocephalic and atraumatic.  Eyes: EOM are normal.  Neck: Normal range of motion.  Cardiovascular: Normal rate and regular rhythm.  Pulmonary/Chest: Effort normal and breath sounds normal. No respiratory distress. She has no wheezes. She has no rales.  Abdominal: Soft. Bowel sounds are normal. She exhibits no distension. There is no tenderness. There is no rebound.  Musculoskeletal: She exhibits no edema.  Neurological: She is alert and oriented to person, place, and time. Coordination normal.  Skin: Skin is warm and dry.  Psychiatric:  Some distress when talking about divorce   Vitals:   05/11/17 1031  BP: 118/82  Pulse: 74  Temp: 98.4 F (36.9 C)  TempSrc: Oral  SpO2: 100%  Weight: 145 lb (65.8 kg)  Height: 5\' 5"  (1.651 m)      Assessment & Plan:

## 2017-05-11 NOTE — Patient Instructions (Signed)
We are checking the labs today.  We have sent in meloxicam for the pain in the hip to take 1 pill daily.   We have sent in trazodone for sleep to take 1 pill at night time for sleep.   Health Maintenance, Female Adopting a healthy lifestyle and getting preventive care can go a long way to promote health and wellness. Talk with your health care provider about what schedule of regular examinations is right for you. This is a good chance for you to check in with your provider about disease prevention and staying healthy. In between checkups, there are plenty of things you can do on your own. Experts have done a lot of research about which lifestyle changes and preventive measures are most likely to keep you healthy. Ask your health care provider for more information. Weight and diet Eat a healthy diet  Be sure to include plenty of vegetables, fruits, low-fat dairy products, and lean protein.  Do not eat a lot of foods high in solid fats, added sugars, or salt.  Get regular exercise. This is one of the most important things you can do for your health. ? Most adults should exercise for at least 150 minutes each week. The exercise should increase your heart rate and make you sweat (moderate-intensity exercise). ? Most adults should also do strengthening exercises at least twice a week. This is in addition to the moderate-intensity exercise.  Maintain a healthy weight  Body mass index (BMI) is a measurement that can be used to identify possible weight problems. It estimates body fat based on height and weight. Your health care provider can help determine your BMI and help you achieve or maintain a healthy weight.  For females 31 years of age and older: ? A BMI below 18.5 is considered underweight. ? A BMI of 18.5 to 24.9 is normal. ? A BMI of 25 to 29.9 is considered overweight. ? A BMI of 30 and above is considered obese.  Watch levels of cholesterol and blood lipids  You should start  having your blood tested for lipids and cholesterol at 47 years of age, then have this test every 5 years.  You may need to have your cholesterol levels checked more often if: ? Your lipid or cholesterol levels are high. ? You are older than 47 years of age. ? You are at high risk for heart disease.  Cancer screening Lung Cancer  Lung cancer screening is recommended for adults 15-60 years old who are at high risk for lung cancer because of a history of smoking.  A yearly low-dose CT scan of the lungs is recommended for people who: ? Currently smoke. ? Have quit within the past 15 years. ? Have at least a 30-pack-year history of smoking. A pack year is smoking an average of one pack of cigarettes a day for 1 year.  Yearly screening should continue until it has been 15 years since you quit.  Yearly screening should stop if you develop a health problem that would prevent you from having lung cancer treatment.  Breast Cancer  Practice breast self-awareness. This means understanding how your breasts normally appear and feel.  It also means doing regular breast self-exams. Let your health care provider know about any changes, no matter how small.  If you are in your 20s or 30s, you should have a clinical breast exam (CBE) by a health care provider every 1-3 years as part of a regular health exam.  If you are  19 or older, have a CBE every year. Also consider having a breast X-ray (mammogram) every year.  If you have a family history of breast cancer, talk to your health care provider about genetic screening.  If you are at high risk for breast cancer, talk to your health care provider about having an MRI and a mammogram every year.  Breast cancer gene (BRCA) assessment is recommended for women who have family members with BRCA-related cancers. BRCA-related cancers include: ? Breast. ? Ovarian. ? Tubal. ? Peritoneal cancers.  Results of the assessment will determine the need for  genetic counseling and BRCA1 and BRCA2 testing.  Cervical Cancer Your health care provider may recommend that you be screened regularly for cancer of the pelvic organs (ovaries, uterus, and vagina). This screening involves a pelvic examination, including checking for microscopic changes to the surface of your cervix (Pap test). You may be encouraged to have this screening done every 3 years, beginning at age 79.  For women ages 74-65, health care providers may recommend pelvic exams and Pap testing every 3 years, or they may recommend the Pap and pelvic exam, combined with testing for human papilloma virus (HPV), every 5 years. Some types of HPV increase your risk of cervical cancer. Testing for HPV may also be done on women of any age with unclear Pap test results.  Other health care providers may not recommend any screening for nonpregnant women who are considered low risk for pelvic cancer and who do not have symptoms. Ask your health care provider if a screening pelvic exam is right for you.  If you have had past treatment for cervical cancer or a condition that could lead to cancer, you need Pap tests and screening for cancer for at least 20 years after your treatment. If Pap tests have been discontinued, your risk factors (such as having a new sexual partner) need to be reassessed to determine if screening should resume. Some women have medical problems that increase the chance of getting cervical cancer. In these cases, your health care provider may recommend more frequent screening and Pap tests.  Colorectal Cancer  This type of cancer can be detected and often prevented.  Routine colorectal cancer screening usually begins at 47 years of age and continues through 47 years of age.  Your health care provider may recommend screening at an earlier age if you have risk factors for colon cancer.  Your health care provider may also recommend using home test kits to check for hidden blood in the  stool.  A small camera at the end of a tube can be used to examine your colon directly (sigmoidoscopy or colonoscopy). This is done to check for the earliest forms of colorectal cancer.  Routine screening usually begins at age 44.  Direct examination of the colon should be repeated every 5-10 years through 47 years of age. However, you may need to be screened more often if early forms of precancerous polyps or small growths are found.  Skin Cancer  Check your skin from head to toe regularly.  Tell your health care provider about any new moles or changes in moles, especially if there is a change in a mole's shape or color.  Also tell your health care provider if you have a mole that is larger than the size of a pencil eraser.  Always use sunscreen. Apply sunscreen liberally and repeatedly throughout the day.  Protect yourself by wearing long sleeves, pants, a wide-brimmed hat, and sunglasses whenever you  are outside.  Heart disease, diabetes, and high blood pressure  High blood pressure causes heart disease and increases the risk of stroke. High blood pressure is more likely to develop in: ? People who have blood pressure in the high end of the normal range (130-139/85-89 mm Hg). ? People who are overweight or obese. ? People who are African American.  If you are 42-77 years of age, have your blood pressure checked every 3-5 years. If you are 28 years of age or older, have your blood pressure checked every year. You should have your blood pressure measured twice--once when you are at a hospital or clinic, and once when you are not at a hospital or clinic. Record the average of the two measurements. To check your blood pressure when you are not at a hospital or clinic, you can use: ? An automated blood pressure machine at a pharmacy. ? A home blood pressure monitor.  If you are between 50 years and 41 years old, ask your health care provider if you should take aspirin to prevent  strokes.  Have regular diabetes screenings. This involves taking a blood sample to check your fasting blood sugar level. ? If you are at a normal weight and have a low risk for diabetes, have this test once every three years after 47 years of age. ? If you are overweight and have a high risk for diabetes, consider being tested at a younger age or more often. Preventing infection Hepatitis B  If you have a higher risk for hepatitis B, you should be screened for this virus. You are considered at high risk for hepatitis B if: ? You were born in a country where hepatitis B is common. Ask your health care provider which countries are considered high risk. ? Your parents were born in a high-risk country, and you have not been immunized against hepatitis B (hepatitis B vaccine). ? You have HIV or AIDS. ? You use needles to inject street drugs. ? You live with someone who has hepatitis B. ? You have had sex with someone who has hepatitis B. ? You get hemodialysis treatment. ? You take certain medicines for conditions, including cancer, organ transplantation, and autoimmune conditions.  Hepatitis C  Blood testing is recommended for: ? Everyone born from [birth year] through 1966. ? Anyone with known risk factors for hepatitis C.  Sexually transmitted infections (STIs)  You should be screened for sexually transmitted infections (STIs) including gonorrhea and chlamydia if: ? You are sexually active and are younger than 47 years of age. ? You are older than 47 years of age and your health care provider tells you that you are at risk for this type of infection. ? Your sexual activity has changed since you were last screened and you are at an increased risk for chlamydia or gonorrhea. Ask your health care provider if you are at risk.  If you do not have HIV, but are at risk, it may be recommended that you take a prescription medicine daily to prevent HIV infection. This is called pre-exposure prophylaxis  (PrEP). You are considered at risk if: ? You are sexually active and do not regularly use condoms or know the HIV status of your partner(s). ? You take drugs by injection. ? You are sexually active with a partner who has HIV.  Talk with your health care provider about whether you are at high risk of being infected with HIV. If you choose to begin PrEP, you should first be  tested for HIV. You should then be tested every 3 months for as long as you are taking PrEP. Pregnancy  If you are premenopausal and you may become pregnant, ask your health care provider about preconception counseling.  If you may become pregnant, take 400 to 800 micrograms (mcg) of folic acid every day.  If you want to prevent pregnancy, talk to your health care provider about birth control (contraception). Osteoporosis and menopause  Osteoporosis is a disease in which the bones lose minerals and strength with aging. This can result in serious bone fractures. Your risk for osteoporosis can be identified using a bone density scan.  If you are 23 years of age or older, or if you are at risk for osteoporosis and fractures, ask your health care provider if you should be screened.  Ask your health care provider whether you should take a calcium or vitamin D supplement to lower your risk for osteoporosis.  Menopause may have certain physical symptoms and risks.  Hormone replacement therapy may reduce some of these symptoms and risks. Talk to your health care provider about whether hormone replacement therapy is right for you. Follow these instructions at home:  Schedule regular health, dental, and eye exams.  Stay current with your immunizations.  Do not use any tobacco products including cigarettes, chewing tobacco, or electronic cigarettes.  If you are pregnant, do not drink alcohol.  If you are breastfeeding, limit how much and how often you drink alcohol.  Limit alcohol intake to no more than 1 drink per day for  nonpregnant women. One drink equals 12 ounces of beer, 5 ounces of wine, or 1 ounces of hard liquor.  Do not use street drugs.  Do not share needles.  Ask your health care provider for help if you need support or information about quitting drugs.  Tell your health care provider if you often feel depressed.  Tell your health care provider if you have ever been abused or do not feel safe at home. This information is not intended to replace advice given to you by your health care provider. Make sure you discuss any questions you have with your health care provider. Document Released: 12/09/2010 Document Revised: 11/01/2015 Document Reviewed: 02/27/2015 Elsevier Interactive Patient Education  [employer].

## 2017-05-26 DIAGNOSIS — F4323 Adjustment disorder with mixed anxiety and depressed mood: Secondary | ICD-10-CM | POA: Diagnosis not present

## 2017-11-03 ENCOUNTER — Other Ambulatory Visit: Payer: Self-pay | Admitting: Internal Medicine

## 2017-11-03 DIAGNOSIS — G44019 Episodic cluster headache, not intractable: Secondary | ICD-10-CM

## 2017-11-03 DIAGNOSIS — F4323 Adjustment disorder with mixed anxiety and depressed mood: Secondary | ICD-10-CM

## 2017-11-03 MED ORDER — CITALOPRAM HYDROBROMIDE 20 MG PO TABS: 20.0000 mg | ORAL_TABLET | Freq: Every day | ORAL | 5 refills | Status: DC

## 2017-11-03 NOTE — Addendum Note (Signed)
Addended by: Stevphen Meuse on: 11/03/2017 05:41 PM   Modules accepted: Orders

## 2017-11-03 NOTE — Telephone Encounter (Signed)
Copied from CRM [id number]. Topic: Quick Communication - Rx Refill/Question >> Nov 03, 2017 11:46 AM Maia Petties wrote: Medication: citalopram (CELEXA) 20 MG tablet - butalbital-aspirin-caffeine (FIORINAL) 50-325-40 MG tablet Pt is out of both medications - appt is scheduled for 11/23/17 Has the patient contacted their pharmacy? Yes - pt states pharmacy requested and refill was denied Preferred Pharmacy (with phone number or street name): [id number] [place], [place] - [address] [phone] (Phone) [phone] (Fax)

## 2017-11-03 NOTE — Telephone Encounter (Signed)
LOV: 02/19/17 with  Nche, Charolette Lum  Appointment scheduled  11/23/17   Last filled 02/19/17

## 2017-11-03 NOTE — Telephone Encounter (Signed)
Sent RX refill request.

## 2017-11-04 MED ORDER — BUTALBITAL-ASPIRIN-CAFFEINE 50-325-40 MG PO TABS: 1.0000 | ORAL_TABLET | Freq: Two times a day (BID) | ORAL | 0 refills | Status: DC | PRN

## 2017-11-04 NOTE — Addendum Note (Signed)
Addended by: Berton Lan R on: 11/04/2017 07:55 AM   Modules accepted: Orders

## 2017-11-04 NOTE — Telephone Encounter (Signed)
LOV: 05/11/2017 NOV: 11/23/2017

## 2017-11-09 ENCOUNTER — Ambulatory Visit: Payer: BLUE CROSS/BLUE SHIELD | Admitting: Internal Medicine

## 2017-11-23 ENCOUNTER — Ambulatory Visit: Payer: BLUE CROSS/BLUE SHIELD | Admitting: Internal Medicine

## 2017-11-23 DIAGNOSIS — Z0289 Encounter for other administrative examinations: Secondary | ICD-10-CM

## 2017-12-01 DIAGNOSIS — Z01419 Encounter for gynecological examination (general) (routine) without abnormal findings: Secondary | ICD-10-CM | POA: Diagnosis not present

## 2018-03-29 ENCOUNTER — Other Ambulatory Visit: Payer: Self-pay | Admitting: Obstetrics and Gynecology

## 2018-03-29 DIAGNOSIS — Z1231 Encounter for screening mammogram for malignant neoplasm of breast: Secondary | ICD-10-CM

## 2018-05-05 ENCOUNTER — Ambulatory Visit
Admission: RE | Admit: 2018-05-05 | Discharge: 2018-05-05 | Disposition: A | Discharge status: Home / Self Care | Payer: BLUE CROSS/BLUE SHIELD | Source: Ambulatory Visit | Attending: Obstetrics and Gynecology | Admitting: Obstetrics and Gynecology

## 2018-05-05 DIAGNOSIS — Z1231 Encounter for screening mammogram for malignant neoplasm of breast: Secondary | ICD-10-CM

## 2018-05-27 ENCOUNTER — Other Ambulatory Visit (INDEPENDENT_AMBULATORY_CARE_PROVIDER_SITE_OTHER): Payer: BLUE CROSS/BLUE SHIELD

## 2018-05-27 ENCOUNTER — Encounter: Payer: Self-pay | Admitting: Internal Medicine

## 2018-05-27 ENCOUNTER — Ambulatory Visit (INDEPENDENT_AMBULATORY_CARE_PROVIDER_SITE_OTHER): Payer: BLUE CROSS/BLUE SHIELD | Admitting: Internal Medicine

## 2018-05-27 VITALS — BP 118/70 | HR 66 | Temp 98.1°F | Ht 65.0 in | Wt 167.0 lb

## 2018-05-27 DIAGNOSIS — F4323 Adjustment disorder with mixed anxiety and depressed mood: Secondary | ICD-10-CM | POA: Diagnosis not present

## 2018-05-27 DIAGNOSIS — Z0000 Encounter for general adult medical examination without abnormal findings: Secondary | ICD-10-CM | POA: Diagnosis not present

## 2018-05-27 DIAGNOSIS — G44019 Episodic cluster headache, not intractable: Secondary | ICD-10-CM

## 2018-05-27 LAB — COMPREHENSIVE METABOLIC PANEL
ALT: 13 U/L (ref 0–35)
AST: 20 U/L (ref 0–37)
Albumin: 4.5 g/dL (ref 3.5–5.2)
Alkaline Phosphatase: 92 U/L (ref 39–117)
BUN: 12 mg/dL (ref 6–23)
CO2: 30 mEq/L (ref 19–32)
Calcium: 10 mg/dL (ref 8.4–10.5)
Chloride: 100 mEq/L (ref 96–112)
Creatinine, Ser: 0.66 mg/dL (ref 0.40–1.20)
GFR: 122.81 mL/min (ref >60.00)
Glucose, Bld: 90 mg/dL (ref 70–99)
Potassium: 3.9 mEq/L (ref 3.5–5.1)
Sodium: 137 mEq/L (ref 135–145)
Total Bilirubin: 0.3 mg/dL (ref 0.2–1.2)
Total Protein: 8.2 g/dL (ref 6.0–8.3)

## 2018-05-27 LAB — CBC
HCT: 38.3 % (ref 36.0–46.0)
Hemoglobin: 12.9 g/dL (ref 12.0–15.0)
MCHC: 33.7 g/dL (ref 30.0–36.0)
MCV: 90.2 fl (ref 78.0–100.0)
Platelets: 276 10*3/uL (ref 150.0–400.0)
RBC: 4.24 Mil/uL (ref 3.87–5.11)
RDW: 14 % (ref 11.5–15.5)
WBC: 7.2 10*3/uL (ref 4.0–10.5)

## 2018-05-27 LAB — LIPID PANEL
Cholesterol: 148 mg/dL (ref 0–200)
HDL: 59.8 mg/dL (ref >39.00)
LDL Cholesterol: 78 mg/dL (ref 0–99)
NonHDL: 88.07
Total CHOL/HDL Ratio: 2
Triglycerides: 52 mg/dL (ref 0.0–149.0)
VLDL: 10.4 mg/dL (ref 0.0–40.0)

## 2018-05-27 NOTE — Progress Notes (Signed)
° °  Subjective:    Patient ID: Lauren Olson, female    DOB: 03/27/1971, 47 y.o.   MRN: [id number]  HPI The patient is a 47 YO female coming in for physical. Recently divorced and happy about that.   [facility], [facility], social history reviewed and updated  Review of Systems  Constitutional: Negative.   HENT: Negative.   Eyes: Negative.   Respiratory: Negative for cough, chest tightness and shortness of breath.   Cardiovascular: Negative for chest pain, palpitations and leg swelling.  Gastrointestinal: Negative for abdominal distention, abdominal pain, constipation, diarrhea, nausea and vomiting.  Musculoskeletal: Negative.   Skin: Negative.   Neurological: Negative.   Psychiatric/Behavioral: Negative.       Objective:   Physical Exam Constitutional:      Appearance: She is well-developed.  HENT:     Head: Normocephalic and atraumatic.  Neck:     Musculoskeletal: Normal range of motion.  Cardiovascular:     Rate and Rhythm: Normal rate and regular rhythm.  Pulmonary:     Effort: Pulmonary effort is normal. No respiratory distress.     Breath sounds: Normal breath sounds. No wheezing or rales.  Abdominal:     General: Bowel sounds are normal. There is no distension.     Palpations: Abdomen is soft.     Tenderness: There is no abdominal tenderness. There is no rebound.  Skin:    General: Skin is warm and dry.  Neurological:     Mental Status: She is alert and oriented to person, place, and time.     Coordination: Coordination normal.    Vitals:   05/27/18 1556  BP: 118/70  Pulse: 66  Temp: 98.1 F (36.7 C)  TempSrc: Oral  SpO2: 99%  Weight: 167 lb (75.8 kg)  Height: 5\' 5"  (1.651 m)      Assessment & Plan:

## 2018-05-27 NOTE — Patient Instructions (Signed)
Health Maintenance, Female °Adopting a healthy lifestyle and getting preventive care can go a long way to promote health and wellness. Talk with your health care provider about what schedule of regular examinations is right for you. This is a good chance for you to check in with your provider about disease prevention and staying healthy. °In between checkups, there are plenty of things you can do on your own. Experts have done a lot of research about which lifestyle changes and preventive measures are most likely to keep you healthy. Ask your health care provider for more information. °Weight and diet °Eat a healthy diet °· Be sure to include plenty of vegetables, fruits, low-fat dairy products, and lean protein. °· Do not eat a lot of foods high in solid fats, added sugars, or salt. °· Get regular exercise. This is one of the most important things you can do for your health. °? Most adults should exercise for at least 150 minutes each week. The exercise should increase your heart rate and make you sweat (moderate-intensity exercise). °? Most adults should also do strengthening exercises at least twice a week. This is in addition to the moderate-intensity exercise. °Maintain a healthy weight °· Body mass index (BMI) is a measurement that can be used to identify possible weight problems. It estimates body fat based on height and weight. Your health care provider can help determine your BMI and help you achieve or maintain a healthy weight. °· For females 20 years of age and older: °? A BMI below 18.5 is considered underweight. °? A BMI of 18.5 to 24.9 is normal. °? A BMI of 25 to 29.9 is considered overweight. °? A BMI of 30 and above is considered obese. °Watch levels of cholesterol and blood lipids °· You should start having your blood tested for lipids and cholesterol at 48 years of age, then have this test every 5 years. °· You may need to have your cholesterol levels checked more often if: °? Your lipid or  cholesterol levels are high. °? You are older than 48 years of age. °? You are at high risk for heart disease. °Cancer screening °Lung Cancer °· Lung cancer screening is recommended for adults 55-80 years old who are at high risk for lung cancer because of a history of smoking. °· A yearly low-dose CT scan of the lungs is recommended for people who: °? Currently smoke. °? Have quit within the past 15 years. °? Have at least a 30-pack-year history of smoking. A pack year is smoking an average of one pack of cigarettes a day for 1 year. °· Yearly screening should continue until it has been 15 years since you quit. °· Yearly screening should stop if you develop a health problem that would prevent you from having lung cancer treatment. °Breast Cancer °· Practice breast self-awareness. This means understanding how your breasts normally appear and feel. °· It also means doing regular breast self-exams. Let your health care provider know about any changes, no matter how small. °· If you are in your 20s or 30s, you should have a clinical breast exam (CBE) by a health care provider every 1-3 years as part of a regular health exam. °· If you are 40 or older, have a CBE every year. Also consider having a breast X-ray (mammogram) every year. °· If you have a family history of breast cancer, talk to your health care provider about genetic screening. °· If you are at high risk for breast cancer, talk   to your health care provider about having an MRI and a mammogram every year.  Breast cancer gene (BRCA) assessment is recommended for women who have family members with BRCA-related cancers. BRCA-related cancers include: ? Breast. ? Ovarian. ? Tubal. ? Peritoneal cancers.  Results of the assessment will determine the need for genetic counseling and BRCA1 and BRCA2 testing. Cervical Cancer Your health care provider may recommend that you be screened regularly for cancer of the pelvic organs (ovaries, uterus, and vagina).  This screening involves a pelvic examination, including checking for microscopic changes to the surface of your cervix (Pap test). You may be encouraged to have this screening done every 3 years, beginning at age 31.  For women ages 19-65, health care providers may recommend pelvic exams and Pap testing every 3 years, or they may recommend the Pap and pelvic exam, combined with testing for human papilloma virus (HPV), every 5 years. Some types of HPV increase your risk of cervical cancer. Testing for HPV may also be done on women of any age with unclear Pap test results.  Other health care providers may not recommend any screening for nonpregnant women who are considered low risk for pelvic cancer and who do not have symptoms. Ask your health care provider if a screening pelvic exam is right for you.  If you have had past treatment for cervical cancer or a condition that could lead to cancer, you need Pap tests and screening for cancer for at least 20 years after your treatment. If Pap tests have been discontinued, your risk factors (such as having a new sexual partner) need to be reassessed to determine if screening should resume. Some women have medical problems that increase the chance of getting cervical cancer. In these cases, your health care provider may recommend more frequent screening and Pap tests. Colorectal Cancer  This type of cancer can be detected and often prevented.  Routine colorectal cancer screening usually begins at 48 years of age and continues through 48 years of age.  Your health care provider may recommend screening at an earlier age if you have risk factors for colon cancer.  Your health care provider may also recommend using home test kits to check for hidden blood in the stool.  A small camera at the end of a tube can be used to examine your colon directly (sigmoidoscopy or colonoscopy). This is done to check for the earliest forms of colorectal cancer.  Routine  screening usually begins at age 17.  Direct examination of the colon should be repeated every 5-10 years through 48 years of age. However, you may need to be screened more often if early forms of precancerous polyps or small growths are found. Skin Cancer  Check your skin from head to toe regularly.  Tell your health care provider about any new moles or changes in moles, especially if there is a change in a mole's shape or color.  Also tell your health care provider if you have a mole that is larger than the size of a pencil eraser.  Always use sunscreen. Apply sunscreen liberally and repeatedly throughout the day.  Protect yourself by wearing long sleeves, pants, a wide-brimmed hat, and sunglasses whenever you are outside. Heart disease, diabetes, and high blood pressure  High blood pressure causes heart disease and increases the risk of stroke. High blood pressure is more likely to develop in: ? People who have blood pressure in the high end of the normal range (130-139/85-89 mm Hg). ? People  who are overweight or obese. ? People who are African American.  If you are 78-43 years of age, have your blood pressure checked every 3-5 years. If you are 83 years of age or older, have your blood pressure checked every year. You should have your blood pressure measured twice--once when you are at a hospital or clinic, and once when you are not at a hospital or clinic. Record the average of the two measurements. To check your blood pressure when you are not at a hospital or clinic, you can use: ? An automated blood pressure machine at a pharmacy. ? A home blood pressure monitor.  If you are between 65 years and 60 years old, ask your health care provider if you should take aspirin to prevent strokes.  Have regular diabetes screenings. This involves taking a blood sample to check your fasting blood sugar level. ? If you are at a normal weight and have a low risk for diabetes, have this test once  every three years after 48 years of age. ? If you are overweight and have a high risk for diabetes, consider being tested at a younger age or more often. Preventing infection Hepatitis B  If you have a higher risk for hepatitis B, you should be screened for this virus. You are considered at high risk for hepatitis B if: ? You were born in a country where hepatitis B is common. Ask your health care provider which countries are considered high risk. ? Your parents were born in a high-risk country, and you have not been immunized against hepatitis B (hepatitis B vaccine). ? You have HIV or AIDS. ? You use needles to inject street drugs. ? You live with someone who has hepatitis B. ? You have had sex with someone who has hepatitis B. ? You get hemodialysis treatment. ? You take certain medicines for conditions, including cancer, organ transplantation, and autoimmune conditions. Hepatitis C  Blood testing is recommended for: ? Everyone born from [birth year] through 1966. ? Anyone with known risk factors for hepatitis C. Sexually transmitted infections (STIs)  You should be screened for sexually transmitted infections (STIs) including gonorrhea and chlamydia if: ? You are sexually active and are younger than 48 years of age. ? You are older than 48 years of age and your health care provider tells you that you are at risk for this type of infection. ? Your sexual activity has changed since you were last screened and you are at an increased risk for chlamydia or gonorrhea. Ask your health care provider if you are at risk.  If you do not have HIV, but are at risk, it may be recommended that you take a prescription medicine daily to prevent HIV infection. This is called pre-exposure prophylaxis (PrEP). You are considered at risk if: ? You are sexually active and do not regularly use condoms or know the HIV status of your partner(s). ? You take drugs by injection. ? You are sexually active with a partner  who has HIV. Talk with your health care provider about whether you are at high risk of being infected with HIV. If you choose to begin PrEP, you should first be tested for HIV. You should then be tested every 3 months for as long as you are taking PrEP. Pregnancy  If you are premenopausal and you may become pregnant, ask your health care provider about preconception counseling.  If you may become pregnant, take 400 to 800 micrograms (mcg) of folic acid every  day. °· If you want to prevent pregnancy, talk to your health care provider about birth control (contraception). °Osteoporosis and menopause °· Osteoporosis is a disease in which the bones lose minerals and strength with aging. This can result in serious bone fractures. Your risk for osteoporosis can be identified using a bone density scan. °· If you are 65 years of age or older, or if you are at risk for osteoporosis and fractures, ask your health care provider if you should be screened. °· Ask your health care provider whether you should take a calcium or vitamin D supplement to lower your risk for osteoporosis. °· Menopause may have certain physical symptoms and risks. °· Hormone replacement therapy may reduce some of these symptoms and risks. °Talk to your health care provider about whether hormone replacement therapy is right for you. °Follow these instructions at home: °· Schedule regular health, dental, and eye exams. °· Stay current with your immunizations. °· Do not use any tobacco products including cigarettes, chewing tobacco, or electronic cigarettes. °· If you are pregnant, do not drink alcohol. °· If you are breastfeeding, limit how much and how often you drink alcohol. °· Limit alcohol intake to no more than 1 drink per day for nonpregnant women. One drink equals 12 ounces of beer, 5 ounces of wine, or 1½ ounces of hard liquor. °· Do not use street drugs. °· Do not share needles. °· Ask your health care provider for help if you need support  or information about quitting drugs. °· Tell your health care provider if you often feel depressed. °· Tell your health care provider if you have ever been abused or do not feel safe at home. °This information is not intended to replace advice given to you by your health care provider. Make sure you discuss any questions you have with your health care provider. °Document Released: 12/09/2010 Document Revised: 11/01/2015 Document Reviewed: 02/27/2015 °[employer] Interactive Patient Education © 2019 [employer] ° °

## 2018-05-28 LAB — HIV ANTIBODY (ROUTINE TESTING W REFLEX): HIV 1&2 Ab, 4th Generation: NONREACTIVE

## 2018-05-28 NOTE — Assessment & Plan Note (Signed)
Flu shot up to date. Tetanus up to date. Mammogram up to date, pap smear up to date. Counseled about sun safety and mole surveillance. Counseled about the dangers of distracted driving. Given 10 year screening recommendations.  °

## 2018-05-28 NOTE — Assessment & Plan Note (Signed)
Has butalbital if needed and meloxicam for headaches.

## 2018-05-28 NOTE — Assessment & Plan Note (Signed)
Taking celexa 20 mg daily and no adjustment today.

## 2018-10-07 ENCOUNTER — Other Ambulatory Visit: Payer: Self-pay

## 2018-10-07 ENCOUNTER — Ambulatory Visit: Payer: Self-pay | Admitting: *Deleted

## 2018-10-07 VITALS — BP 93/71 | HR 73 | Ht 65.0 in | Wt 153.0 lb

## 2018-10-07 DIAGNOSIS — Z0000 Encounter for general adult medical examination without abnormal findings: Secondary | ICD-10-CM

## 2018-10-07 NOTE — Progress Notes (Signed)
[employer] insurance premium discount evaluation: Labs Drawn. Replacements ROI form signed. Tobacco Free Attestation form signed.  Forms placed in paper chart. Okay to route results to pcp per pt.

## 2018-10-08 LAB — CMP12+LP+TP+TSH+6AC+CBC/D/PLT
ALT: 13 IU/L (ref 0–32)
AST: 21 IU/L (ref 0–40)
Albumin/Globulin Ratio: 1.6 (ref 1.2–2.2)
Albumin: 4.6 g/dL (ref 3.8–4.8)
Alkaline Phosphatase: 86 IU/L (ref 39–117)
BUN/Creatinine Ratio: 18 (ref 9–23)
BUN: 12 mg/dL (ref 6–24)
Basophils Absolute: 0 10*3/uL (ref 0.0–0.2)
Basos: 0 %
Bilirubin Total: 0.4 mg/dL (ref 0.0–1.2)
Calcium: 9.5 mg/dL (ref 8.7–10.2)
Chloride: 100 mmol/L (ref 96–106)
Chol/HDL Ratio: 2.7 ratio (ref 0.0–4.4)
Cholesterol, Total: 148 mg/dL (ref 100–199)
Creatinine, Ser: 0.67 mg/dL (ref 0.57–1.00)
EOS (ABSOLUTE): 0.4 10*3/uL (ref 0.0–0.4)
Eos: 7 %
Estimated CHD Risk: <0.5 times avg. (ref 0.0–1.0)
Free Thyroxine Index: 1.3 (ref 1.2–4.9)
GFR calc Af Amer: 120 mL/min/{1.73_m2} (ref >59)
GFR calc non Af Amer: 104 mL/min/{1.73_m2} (ref >59)
GGT: 11 IU/L (ref 0–60)
Globulin, Total: 2.8 g/dL (ref 1.5–4.5)
Glucose: 66 mg/dL (ref 65–99)
HDL: 55 mg/dL (ref >39)
Hematocrit: 37.5 % (ref 34.0–46.6)
Hemoglobin: 12.7 g/dL (ref 11.1–15.9)
Immature Grans (Abs): 0 10*3/uL (ref 0.0–0.1)
Immature Granulocytes: 0 %
Iron: 87 ug/dL (ref 27–159)
LDH: 171 IU/L (ref 119–226)
LDL Calculated: 85 mg/dL (ref 0–99)
Lymphocytes Absolute: 1.6 10*3/uL (ref 0.7–3.1)
Lymphs: 31 %
MCH: 30 pg (ref 26.6–33.0)
MCHC: 33.9 g/dL (ref 31.5–35.7)
MCV: 88 fL (ref 79–97)
Monocytes Absolute: 0.4 10*3/uL (ref 0.1–0.9)
Monocytes: 7 %
Neutrophils Absolute: 2.9 10*3/uL (ref 1.4–7.0)
Neutrophils: 55 %
Phosphorus: 3.1 mg/dL (ref 3.0–4.3)
Platelets: 270 10*3/uL (ref 150–450)
Potassium: 4.1 mmol/L (ref 3.5–5.2)
RBC: 4.24 x10E6/uL (ref 3.77–5.28)
RDW: 12.5 % (ref 11.7–15.4)
Sodium: 139 mmol/L (ref 134–144)
T3 Uptake Ratio: 23 % — ABNORMAL LOW (ref 24–39)
T4, Total: 5.6 ug/dL (ref 4.5–12.0)
TSH: 1.32 u[IU]/mL (ref 0.450–4.500)
Total Protein: 7.4 g/dL (ref 6.0–8.5)
Triglycerides: 39 mg/dL (ref 0–149)
Uric Acid: 3.4 mg/dL (ref 2.5–7.1)
VLDL Cholesterol Cal: 8 mg/dL (ref 5–40)
WBC: 5.2 10*3/uL (ref 3.4–10.8)

## 2018-10-08 LAB — HGB A1C W/O EAG: Hgb A1c MFr Bld: 5.3 % (ref 4.8–5.6)

## 2018-10-08 NOTE — Progress Notes (Signed)
Noted ° °

## 2018-10-26 ENCOUNTER — Other Ambulatory Visit: Payer: Self-pay

## 2018-10-26 ENCOUNTER — Encounter: Payer: Self-pay | Admitting: Registered Nurse

## 2018-10-26 ENCOUNTER — Ambulatory Visit: Payer: Self-pay | Admitting: Registered Nurse

## 2018-10-26 VITALS — BP 119/79 | HR 75 | Temp 97.2°F

## 2018-10-26 DIAGNOSIS — J019 Acute sinusitis, unspecified: Secondary | ICD-10-CM

## 2018-10-26 DIAGNOSIS — G44019 Episodic cluster headache, not intractable: Secondary | ICD-10-CM

## 2018-10-26 MED ORDER — SALINE SPRAY 0.65 % NA SOLN: 2.0000 | NASAL | 0 refills | Status: DC

## 2018-10-26 MED ORDER — IBUPROFEN 800 MG PO TABS: 800.0000 mg | ORAL_TABLET | Freq: Three times a day (TID) | ORAL | 0 refills | Status: AC | PRN
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
## 2018-10-26 NOTE — Patient Instructions (Signed)
Sinus Headache  A sinus headache occurs when your sinuses become clogged or swollen. Sinuses are air-filled spaces in your skull that are behind the bones of your face and forehead. Sinus headaches can range from mild to severe. What are the causes? A sinus headache can result from various conditions that affect the sinuses. Common causes include:  Colds.  Sinus infections.  Allergies. Many people confuse sinus headaches with migraines or tension headaches because those headaches can also cause facial pain and nasal symptoms. What are the signs or symptoms? The main symptom of this condition is a headache that may feel like pain or pressure in your face, forehead, ears, or upper teeth. People who have a sinus headache often have other symptoms, such as:  Congested or runny nose.  Fever.  Inability to smell. Weather changes can make symptoms worse. How is this diagnosed? This condition may be diagnosed based on:  A physical exam and medical history.  Imaging tests, such as a CT scan or MRI, to check for problems with the sinuses.  Examination of the sinuses using a thin tool with a camera that is inserted through your nose (endoscopy). How is this treated? Treatment for this condition depends on the cause.  Sinus pain that is caused by a sinus infection may be treated with antibiotic medicine.  Sinus pain that is caused by allergies may be helped by allergy medicines (antihistamines) and medicated nasal sprays.  Sinus pain that is caused by congestion may be helped by rinsing out (flushing) the nose and sinuses with saline solution.  Sinus surgery may be needed in some cases if other treatments do not help. Follow these instructions at home: General instructions  If directed: ? Apply a warm, moist washcloth to your face to help relieve pain. ? Use a nasal saline wash. Medicines   Take over-the-counter and prescription medicines only as told by your health care  provider.  If you were prescribed an antibiotic medicine, take it as told by your health care provider. Do not stop taking the antibiotic even if you start to feel better.  If you have congestion, use a nasal spray to help lessen pressure. Hydrate and humidify  Drink enough water to keep your urine clear or pale yellow. Staying hydrated will help to thin your mucus.  Use a cool mist humidifier to keep the humidity level in your home above 50%.  Inhale steam for 10-15 minutes, 3-4 times a day or as told by your health care provider. You can do this in the bathroom while a hot shower is running.  Limit your exposure to cool or dry air. Contact a health care provider if:  You have a headache more than one time a week.  You have sensitivity to light or sound.  You develop a fever.  You feel nauseous or you vomit.  Your headaches do not get better with treatment. Many people think that they have a sinus headache when they actually have a migraine or a tension headache. Get help right away if:  You have vision problems.  You have sudden, severe pain in your face or head.  You have a seizure.  You are confused.  You have a stiff neck. Summary  A sinus headache occurs when your sinuses become clogged or swollen.  A sinus headache can result from various conditions that affect the sinuses, such as a cold, a sinus infection, or an allergy.  Treatment for this condition depends on the cause. It may include  medicine, such as antibiotics or antihistamines. This information is not intended to replace advice given to you by your health care provider. Make sure you discuss any questions you have with your health care provider. Document Released: 07/03/2004 Document Revised: 03/06/2017 Document Reviewed: 03/06/2017 Elsevier Interactive Patient Education  2019 [employer]. How to Perform a Sinus Rinse A sinus rinse is a home treatment that is used to rinse your sinuses with a sterile  mixture of salt and water (saline solution). Sinuses are air-filled spaces in your skull behind the bones of your face and forehead that open into your nasal cavity. A sinus rinse can help to clear mucus, dirt, dust, or pollen from your nasal cavity. You may do a sinus rinse when you have a cold, a virus, nasal allergy symptoms, a sinus infection, or stuffiness in your nose or sinuses. Talk with your health care provider about whether a sinus rinse might help you. What are the risks? A sinus rinse is generally safe and effective. However, there are a few risks, which include:  A burning sensation in your sinuses. This may happen if you do not make the saline solution as directed. Be sure to follow all directions when making the saline solution.  Nasal irritation.  Infection from contaminated water. This is rare, but possible. Do not do a sinus rinse if you have had ear or nasal surgery, ear infection, or blocked ears. Supplies needed:  Saline solution or powder.  Distilled or sterile water may be needed to mix with saline powder. ? You may use boiled and cooled tap water. Boil tap water for 5 minutes; cool until it is lukewarm. Use within 24 hours. ? Do not use regular tap water to mix with the saline solution.  Neti pot or nasal rinse bottle. These supplies release the saline solution into your nose and through your sinuses. Neti pots and nasal rinse bottles can be purchased at [employer], a health food store, or online. How to perform a sinus rinse  1. Wash your hands with soap and water. 2. Wash your device according to the directions that came with the product and then dry it. 3. Use the solution that comes with your product or one that is sold separately in stores. Follow the mixing directions on the package if you need to mix with sterile or distilled water. 4. Fill the device with the amount of saline solution noted in the device instructions. 5. Stand over a sink and tilt  your head sideways over the sink. 6. Place the spout of the device in your upper nostril (the one closer to the ceiling). 7. Gently pour or squeeze the saline solution into your nasal cavity. The liquid should drain out from the lower nostril if you are not too congested. 8. While rinsing, breathe through your open mouth. 9. Gently blow your nose to clear any mucus and rinse solution. Blowing too hard may cause ear pain. 10. Repeat in your other nostril. 11. Clean and rinse your device with clean water and then air-dry it. Talk with your health care provider or pharmacist if you have questions about how to do a sinus rinse. Summary  A sinus rinse is a home treatment that is used to rinse your sinuses with a sterile mixture of salt and water (saline solution).  A sinus rinse is generally safe and effective. Follow all instructions carefully.  Before doing a sinus rinse, talk with your health care provider about whether it would be helpful  for you. This information is not intended to replace advice given to you by your health care provider. Make sure you discuss any questions you have with your health care provider. Document Released: 12/21/2013 Document Revised: 03/23/2017 Document Reviewed: 03/23/2017 Elsevier Interactive Patient Education  2019 [employer] Cluster Headache A cluster headache is a type of headache that causes deep, intense head pain. Cluster headaches can last from 15 minutes to 3 hours. They usually occur:  On one side of the head. They may occur on the other side when a new cluster of headaches begins.  Repeatedly over weeks to months.  Several times a day.  At the same time of day, often at night.  More often in the fall and springtime. What are the causes? The cause of this condition is not known. What increases the risk? This condition is more likely to develop in:  Males.  People who drink alcohol.  People who smoke or use products that contain  nicotine or tobacco.  People who take medicines that cause blood vessels to expand, such as nitroglycerin.  People who take antihistamines. What are the signs or symptoms? Symptoms of this condition include:  Severe pain on one side of the head that begins behind or around your eye or temple.  Pain on one side of the head.  Nausea.  Sensitivity to light.  Runny nose and nasal stuffiness.  Sweaty, pale skin on the face.  Droopy or swollen eyelid, eye redness, or tearing.  Restlessness and agitation. How is this diagnosed? This condition may be diagnosed based on:  Your symptoms.  A physical exam. Your health care provider may order tests to see if your headaches are caused by another medical condition. These tests may show that you do not have cluster headaches. Tests may include:  A CT scan of your head.  An MRI of your head.  Lab tests. How is this treated? This condition may be treated with:  Medicines to relieve pain and to prevent repeated (recurrent) attacks. Some people may need a combination of medicines.  Oxygen. This helps to relieve pain. Follow these instructions at home: Headache diary Keep a headache diary as told by your health care provider. Doing this can help you and your health care provider figure out what triggers your headaches. In your headache diary, include information about:  The time of day that your headache started and what you were doing when it began.  How long your headache lasted.  Where your pain started and whether it moved to other areas.  The type of pain, such as burning, stabbing, throbbing, or cramping.  Your level of pain. Use a pain scale and rate the pain with a number from 1 (mild) up to 10 (severe).  The treatment that you used, and any change in symptoms after treatment.  Medicines  Take over-the-counter and prescription medicines only as told by your health care provider.  Do not drive or use heavy machinery  while taking prescription pain medicine.  Use oxygen as told by your health care provider. Lifestyle  Follow a regular sleep schedule. Do not vary the time that you go to bed or the amount that you sleep from day to day. It is important to stay on the same schedule during a cluster period to help prevent headaches.  Exercise regularly.  Eat a healthy diet and avoid foods that may trigger your headaches.  Avoid alcohol.  Do not use any products that contain nicotine or tobacco, such  as cigarettes and e-cigarettes. If you need help quitting, ask your health care provider. Contact a health care provider if:  Your headaches change, become more severe, or occur more often.  The medicine or oxygen that your health care provider recommended does not help. Get help right away if:  You faint.  You have weakness or numbness, especially on one side of your body or face.  You have double vision.  You have nausea or vomiting that does not go away within several hours.  You have trouble talking, walking, or keeping your balance.  You have pain or stiffness in your neck.  You have a fever. Summary  A cluster headache is a type of headache that causes deep, intense head pain, usually on one side of the head.  Keep a headache diary to help discover what triggers your headaches.  A regular sleep schedule can help prevent headaches. This information is not intended to replace advice given to you by your health care provider. Make sure you discuss any questions you have with your health care provider. Document Released: 05/26/2005 Document Revised: 02/05/2016 Document Reviewed: 02/05/2016 Elsevier Interactive Patient Education  2019 [employer] Migraine Headache A migraine headache is an intense, throbbing pain on one side or both sides of the head. Migraines may also cause other symptoms, such as nausea, vomiting, and sensitivity to light and noise. What are the causes? Doing or taking  certain things may also trigger migraines, such as:  Alcohol.  Smoking.  Medicines, such as: ? Medicine used to treat chest pain (nitroglycerine). ? Birth control pills. ? Estrogen pills. ? Certain blood pressure medicines.  Aged cheeses, chocolate, or caffeine.  Foods or drinks that contain nitrates, glutamate, aspartame, or tyramine.  Physical activity. Other things that may trigger a migraine include:  Menstruation.  Pregnancy.  Hunger.  Stress, lack of sleep, too much sleep, or fatigue.  Weather changes. What increases the risk? The following factors may make you more likely to experience migraine headaches:  Age. Risk increases with age.  Family history of migraine headaches.  Being Caucasian.  Depression and anxiety.  Obesity.  Being a woman.  Having a hole in the heart (patent foramen ovale) or other heart problems. What are the signs or symptoms? The main symptom of this condition is pulsating or throbbing pain. Pain may:  Happen in any area of the head, such as on one side or both sides.  Interfere with daily activities.  Get worse with physical activity.  Get worse with exposure to bright lights or loud noises. Other symptoms may include:  Nausea.  Vomiting.  Dizziness.  General sensitivity to bright lights, loud noises, or smells. Before you get a migraine, you may get warning signs that a migraine is developing (aura). An aura may include:  Seeing flashing lights or having blind spots.  Seeing bright spots, halos, or zigzag lines.  Having tunnel vision or blurred vision.  Having numbness or a tingling feeling.  Having trouble talking.  Having muscle weakness. How is this diagnosed? A migraine headache can be diagnosed based on:  Your symptoms.  A physical exam.  Tests, such as CT scan or MRI of the head. These imaging tests can help rule out other causes of headaches.  Taking fluid from the spine (lumbar puncture) and  analyzing it (cerebrospinal fluid analysis, or CSF analysis). How is this treated? A migraine headache is usually treated with medicines that:  Relieve pain.  Relieve nausea.  Prevent migraines from coming  back. Treatment may also include:  Acupuncture.  Lifestyle changes like avoiding foods that trigger migraines. Follow these instructions at home: Medicines  Take over-the-counter and prescription medicines only as told by your health care provider.  Do not drive or use heavy machinery while taking prescription pain medicine.  To prevent or treat constipation while you are taking prescription pain medicine, your health care provider may recommend that you: ? Drink enough fluid to keep your urine clear or pale yellow. ? Take over-the-counter or prescription medicines. ? Eat foods that are high in fiber, such as fresh fruits and vegetables, whole grains, and beans. ? Limit foods that are high in fat and processed sugars, such as fried and [place] foods. Lifestyle  Avoid alcohol use.  Do not use any products that contain nicotine or tobacco, such as cigarettes and e-cigarettes. If you need help quitting, ask your health care provider.  Get at least 8 hours of sleep every night.  Limit your stress. General instructions      Keep a journal to find out what may trigger your migraine headaches. For example, write down: ? What you eat and drink. ? How much sleep you get. ? Any change to your diet or medicines.  If you have a migraine: ? Avoid things that make your symptoms worse, such as bright lights. ? It may help to lie down in a dark, quiet room. ? Do not drive or use heavy machinery. ? Ask your health care provider what activities are safe for you while you are experiencing symptoms.  Keep all follow-up visits as told by your health care provider. This is important. Contact a health care provider if:  You develop symptoms that are different or more severe than your  usual migraine symptoms. Get help right away if:  Your migraine becomes severe.  You have a fever.  You have a stiff neck.  You have vision loss.  Your muscles feel weak or like you cannot control them.  You start to lose your balance often.  You develop trouble walking.  You faint. This information is not intended to replace advice given to you by your health care provider. Make sure you discuss any questions you have with your health care provider. Document Released: 05/26/2005 Document Revised: 12/14/2015 Document Reviewed: 11/12/2015 Elsevier Interactive Patient Education  2019 [employer] Migraine Headache A migraine headache is an intense, throbbing pain on one side or both sides of the head. Migraines may also cause other symptoms, such as nausea, vomiting, and sensitivity to light and noise. What are the causes? Doing or taking certain things may also trigger migraines, such as:  Alcohol.  Smoking.  Medicines, such as: ? Medicine used to treat chest pain (nitroglycerine). ? Birth control pills. ? Estrogen pills. ? Certain blood pressure medicines.  Aged cheeses, chocolate, or caffeine.  Foods or drinks that contain nitrates, glutamate, aspartame, or tyramine.  Physical activity. Other things that may trigger a migraine include:  Menstruation.  Pregnancy.  Hunger.  Stress, lack of sleep, too much sleep, or fatigue.  Weather changes. What increases the risk? The following factors may make you more likely to experience migraine headaches:  Age. Risk increases with age.  Family history of migraine headaches.  Being Caucasian.  Depression and anxiety.  Obesity.  Being a woman.  Having a hole in the heart (patent foramen ovale) or other heart problems. What are the signs or symptoms? The main symptom of this condition is pulsating or throbbing pain. Pain  may:  Happen in any area of the head, such as on one side or both sides.  Interfere  with daily activities.  Get worse with physical activity.  Get worse with exposure to bright lights or loud noises. Other symptoms may include:  Nausea.  Vomiting.  Dizziness.  General sensitivity to bright lights, loud noises, or smells. Before you get a migraine, you may get warning signs that a migraine is developing (aura). An aura may include:  Seeing flashing lights or having blind spots.  Seeing bright spots, halos, or zigzag lines.  Having tunnel vision or blurred vision.  Having numbness or a tingling feeling.  Having trouble talking.  Having muscle weakness. How is this diagnosed? A migraine headache can be diagnosed based on:  Your symptoms.  A physical exam.  Tests, such as CT scan or MRI of the head. These imaging tests can help rule out other causes of headaches.  Taking fluid from the spine (lumbar puncture) and analyzing it (cerebrospinal fluid analysis, or CSF analysis). How is this treated? A migraine headache is usually treated with medicines that:  Relieve pain.  Relieve nausea.  Prevent migraines from coming back. Treatment may also include:  Acupuncture.  Lifestyle changes like avoiding foods that trigger migraines. Follow these instructions at home: Medicines  Take over-the-counter and prescription medicines only as told by your health care provider.  Do not drive or use heavy machinery while taking prescription pain medicine.  To prevent or treat constipation while you are taking prescription pain medicine, your health care provider may recommend that you: ? Drink enough fluid to keep your urine clear or pale yellow. ? Take over-the-counter or prescription medicines. ? Eat foods that are high in fiber, such as fresh fruits and vegetables, whole grains, and beans. ? Limit foods that are high in fat and processed sugars, such as fried and [place] foods. Lifestyle  Avoid alcohol use.  Do not use any products that contain nicotine or  tobacco, such as cigarettes and e-cigarettes. If you need help quitting, ask your health care provider.  Get at least 8 hours of sleep every night.  Limit your stress. General instructions      Keep a journal to find out what may trigger your migraine headaches. For example, write down: ? What you eat and drink. ? How much sleep you get. ? Any change to your diet or medicines.  If you have a migraine: ? Avoid things that make your symptoms worse, such as bright lights. ? It may help to lie down in a dark, quiet room. ? Do not drive or use heavy machinery. ? Ask your health care provider what activities are safe for you while you are experiencing symptoms.  Keep all follow-up visits as told by your health care provider. This is important. Contact a health care provider if:  You develop symptoms that are different or more severe than your usual migraine symptoms. Get help right away if:  Your migraine becomes severe.  You have a fever.  You have a stiff neck.  You have vision loss.  Your muscles feel weak or like you cannot control them.  You start to lose your balance often.  You develop trouble walking.  You faint. This information is not intended to replace advice given to you by your health care provider. Make sure you discuss any questions you have with your health care provider. Document Released: 05/26/2005 Document Revised: 12/14/2015 Document Reviewed: 11/12/2015 Elsevier Interactive Patient Education  2019  [employer].

## 2018-11-26 ENCOUNTER — Other Ambulatory Visit: Payer: Self-pay | Admitting: Obstetrics and Gynecology

## 2018-11-26 ENCOUNTER — Other Ambulatory Visit (HOSPITAL_COMMUNITY)
Admission: RE | Admit: 2018-11-26 | Discharge: 2018-11-26 | Disposition: A | Discharge status: Home / Self Care | Payer: PRIVATE HEALTH INSURANCE | Source: Ambulatory Visit | Attending: Obstetrics and Gynecology | Admitting: Obstetrics and Gynecology

## 2018-11-26 DIAGNOSIS — Z01419 Encounter for gynecological examination (general) (routine) without abnormal findings: Secondary | ICD-10-CM | POA: Insufficient documentation

## 2018-11-30 LAB — CYTOLOGY - PAP
Adequacy: ABSENT
Diagnosis: NEGATIVE
HPV: NOT DETECTED

## 2019-02-10 ENCOUNTER — Ambulatory Visit: Payer: Self-pay | Admitting: Registered Nurse

## 2019-02-10 ENCOUNTER — Encounter: Payer: Self-pay | Admitting: Registered Nurse

## 2019-02-10 ENCOUNTER — Other Ambulatory Visit: Payer: Self-pay

## 2019-02-10 VITALS — BP 102/70 | HR 70 | Temp 97.1°F

## 2019-02-10 DIAGNOSIS — M25531 Pain in right wrist: Secondary | ICD-10-CM

## 2019-02-10 DIAGNOSIS — IMO0001 Reserved for inherently not codable concepts without codable children: Secondary | ICD-10-CM

## 2019-02-10 MED ORDER — ACETAMINOPHEN 500 MG PO TABS: 1000.0000 mg | ORAL_TABLET | Freq: Four times a day (QID) | ORAL | 0 refills | Status: AC | PRN

## 2019-02-10 MED ORDER — IBUPROFEN 800 MG PO TABS: 800.0000 mg | ORAL_TABLET | Freq: Three times a day (TID) | ORAL | 0 refills | Status: AC | PRN

## 2019-02-10 MED ORDER — BIOFREEZE 4 % EX GEL: 1.0000 "application " | Freq: Four times a day (QID) | CUTANEOUS | Status: AC | PRN

## 2019-02-10 NOTE — Patient Instructions (Addendum)
CARPAL TUNNEL (Nerve Compression Syndrome): Reverse Namaste    Reach behind back and bring palms together with fingers up. Hold position for __5_ breaths. Repeat__3_ times. Do _3CARPAL TUNNEL (Nerve Compression Syndrome): Wrist Stretch    Extend right arm with fingers facing down. With left hand, gently pull fingers of right hand toward body. Hold position for _5__ breaths. Repeat with arms switched. Repeat _3__ times, alternating arms. Do __3 times per day Wrist Splint, Adult A wrist splint is a device that prevents your wrist from moving. A splint supports your wrist like a cast, but it is more flexible. It can be removed or loosened. The supporting part of a splint does not completely surround your wrist. It is held in place with an elastic band or straps. You may need a wrist splint if you have hurt your wrist or if you have a condition that causes swelling. Depending on the type of wrist problem you have, your splint may extend up your arm, onto your hand, or around your thumb. The wrist splint may be worn to:  Support your wrist.  Protect your injury.  Prevent further injury.  Prevent movement.  Reduce pain.  Help with healing. It is important to follow instructions from your health care provider about when to wear the splint to make sure your wrist heals correctly. What are the risks? The most dangerous complication of wearing a splint is having a reduced blood supply to your wrist or hand. This can happen if there is a lot of swelling or if the splint is too tight. Limited blood supply results in a condition called compartment syndrome and can cause permanent damage. Symptoms include:  Pain that is getting worse.  Tingling and numbness.  Changes in skin color, including paleness or a bluish color.  Cold fingers. Other complications of wearing a splint can include:  Skin irritation that can cause: ? Itching. ? Rash. ? Skin sores. ? Skin infection.  Wrist stiffness.  This can occur if you have worn a splint for a long time.  Wrist weakness. How to use your wrist splint  Your wrist splint should be tight enough to support your wrist without blocking your blood supply. How long you need to wear the splint depends on the type of wrist problem you have. Your health care provider will instruct you about how to wear your wrist splint and how long to wear it. Splint wear  Wear the splint as told by your health care provider. Remove it only as told by your health care provider.  Loosen the splint if your fingers tingle, become numb, or turn cold and blue.  Keep the splint clean.  If the splint is not waterproof: ? Do not let it get wet. ? Cover it with a watertight covering when you take a bath or a shower.  Do not stick anything inside the splint to scratch your skin. Doing that increases your risk of infection.  Check the skin under the splint for any redness or blisters every time you take off the splint. Tell your health care provider about any concerns. Managing pain, stiffness, and swelling  If directed, put ice on the injured area. ? If you a have a removable splint, remove it as told by your health care provider. ? Put ice in a plastic bag. ? Place a towel between your skin and the bag. ? Leave the ice on for 20 minutes, 2-3 times a day.  Move your fingers often to avoid stiffness  and to lessen swelling.  Raise (elevate) the injured area above the level of your heart while you are sitting or lying down. Activity  Return to your normal activities as told by your health care provider. Ask your health care provider what activities are safe for you.  Do exercises as told by your health care provider.  Ask your health care provider when it is safe to drive with a splint on your wrist. General instructions  Do not use the injured limb to support (bear) your body weight until your health care provider says that you can.  Do not put pressure on  any part of the splint until it is fully hardened. This may take several hours.  Do not use any products that contain nicotine or tobacco, such as cigarettes and e-cigarettes. If you need help quitting, ask your health care provider.  Take over-the-counter and prescription medicines only as told by your health care provider.  Keep all follow-up visits as told by your health care provider. This is important. Contact a health care provider if:  You have wrist pain or swelling that does not go away.  The skin around or under your splint becomes red, itchy, or moist.  You have chills or a fever.  Your splint feels too tight or too loose.  Your splint gets damaged. Get help right away if:  You have pain that is getting worse.  You have tingling and numbness.  You have changes in skin color, including paleness or a bluish color.  Your fingers are cold. Summary  A wrist splint is a flexible device that supports your wrist and prevents it from moving.  Follow instructions from your health care provider about when to wear the splint to make sure your wrist heals correctly.  Icing, moving your fingers, and raising (elevating) your wrist above the level of your heart will help you manage pain, stiffness, and swelling.  The most dangerous complication of wearing a splint is having a reduced blood supply to your wrist or hand. If your fingers tingle, become numb, or turn cold and blue, loosen the splint and get help right away. This information is not intended to replace advice given to you by your health care provider. Make sure you discuss any questions you have with your health care provider. Document Released: 05/08/2006 Document Revised: 09/13/2018 Document Reviewed: 08/13/2016 Elsevier Patient Education  2020 [employer] _ times per day.  Copyright  VHI. All rights reserved.  CARPAL TUNNEL (Nerve Compression Syndrome): Hug    Cross right arm over left and walk hands behind  shoulders. Squeeze shoulders and hold position for 5__ breaths. Switch arms and repeat. Repeat __3_ times, alternating arms. Do _3_ times per day.  Copyright  VHI. All rights reserved.   Preventing Carpal Tunnel Syndrome  Carpal tunnel syndrome is a condition that causes pain, numbness, and weakness in the wrist, hand, and fingers. The carpal tunnel is a narrow, hollow space in the wrist. Tendons and one of the main nerves in the hand (median nerve) pass through the carpal tunnel. The median nerve supplies feeling to the thumb and the first three fingers. It also supplies the muscles at the base of the thumb. Carpal tunnel syndrome happens when the median nerve gets squeezed in the area where it passes through the carpal tunnel. In some cases, it may not be possible to prevent carpal tunnel syndrome. However, you can take steps to relieve pressure on your wrist and reduce your risk of  developing this condition. How can this condition affect me? Carpal tunnel syndrome can affect your ability to do jobs or activities that involve hand, wrist, and finger action. It can cause symptoms such as:  Pain in the wrist, hand, and fingers.  Burning, tingling, or numbness in the affected area.  A weak feeling in your hands. You may have trouble grabbing and holding items. Symptoms may get worse over time. For some people, symptoms get worse at night. What can increase my risk? The following factors may make you more likely to develop this condition:  Having a job that requires you to repeatedly move your wrist or requires you to use tools that vibrate. This may include jobs that involve using computers, working on an [employer], or working with power tools such as Radiographer, therapeutic.  Being a woman.  Having a family history of the condition.  Having certain conditions, such as: ? Diabetes. ? Pregnancy. ? Obesity. ? Thyroid disease. ? Rheumatoid arthritis. What actions can I take to help  prevent this condition?      Avoid making repetitive hand and wrist motions that cause your wrist to get stiff or painful.  Take frequent breaks if you use your hands and wrists for many hours at a time.  Stretch your hands and fingers often to get blood flowing and relieve tension.  Keep your wrists in the natural position when using a computer keyboard or mouse. Do not bend your wrists downward or sideways.  If you use your hands and wrists for many hours at work, make changes to your work space to ease pressure on your wrists. You may want to use: ? A padded wrist rest for computer work. ? A slanted computer keyboard. ? Hand tools with padded handles to reduce vibrations.  Consider wearing a wrist brace. This will not prevent carpal tunnel syndrome but may keep it from getting worse. A wrist brace reduces bending and stress.  Closely manage any medical conditions you have that can put you at risk for carpal tunnel syndrome. Have your blood sugar checked to make sure you are not developing diabetes. If you have diabetes, work with your health care provider to keep your blood sugar under control. Where to find more information  [employer] of Neurological Disorders and Stroke: BasicFM.no  American Academy of Family Physicians: Hydrologist.org Contact a health care provider if:  You have numbness or tingling in your wrist, hand, or fingers.  You have pain or a burning sensation in your wrist, hand, or fingers.  Pain, tingling, or burning wakes you up at night.  Your hand becomes weak and clumsy.  You frequently drop objects.  You are unable to use your wrists and hands without pain. Summary  Carpal tunnel syndrome is a condition that causes pain, numbness, and weakness in the wrist, hand, and fingers.  You can take steps to relieve pressure on your wrist and reduce your risk of developing this condition.  Avoid making repetitive hand and wrist motions that  cause your wrist to get stiff or painful.  If you use your hands and wrists for many hours at work, you may want to make changes to your work space to ease pressure on your wrists.  Take frequent breaks to stretch your hands and fingers. This information is not intended to replace advice given to you by your health care provider. Make sure you discuss any questions you have with your health care provider. Document Released: 10/08/2017 Document Revised:  10/08/2017 Document Reviewed: 10/08/2017 Elsevier Patient Education  [place] Syndrome  Carpal tunnel syndrome is a condition that causes pain in your hand and arm. The carpal tunnel is a narrow area located on the palm side of your wrist. Repeated wrist motion or certain diseases may cause swelling within the tunnel. This swelling pinches the main nerve in the wrist (median nerve). What are the causes? This condition may be caused by:  Repeated wrist motions.  Wrist injuries.  Arthritis.  A cyst or tumor in the carpal tunnel.  Fluid buildup during pregnancy. Sometimes the cause of this condition is not known. What increases the risk? The following factors may make you more likely to develop this condition:  Having a job, such as being a Research scientist (life sciences), that requires you to repeatedly move your wrist in the same motion.  Being a woman.  Having certain conditions, such as: ? Diabetes. ? Obesity. ? An underactive thyroid (hypothyroidism). ? Kidney failure. What are the signs or symptoms? Symptoms of this condition include:  A tingling feeling in your fingers, especially in your thumb, index, and middle fingers.  Tingling or numbness in your hand.  An aching feeling in your entire arm, especially when your wrist and elbow are bent for a long time.  Wrist pain that goes up your arm to your shoulder.  Pain that goes down into your palm or fingers.  A weak feeling in your hands. You may have  trouble grabbing and holding items. Your symptoms may feel worse during the night. How is this diagnosed? This condition is diagnosed with a medical history and physical exam. You may also have tests, including:  Electromyogram (EMG). This test measures electrical signals sent by your nerves into the muscles.  Nerve conduction study. This test measures how well electrical signals pass through your nerves.  Imaging tests, such as X-rays, ultrasound, and MRI. These tests check for possible causes of your condition. How is this treated? This condition may be treated with:  Lifestyle changes. It is important to stop or change the activity that caused your condition.  Doing exercise and activities to strengthen your muscles and bones (physical therapy).  Learning how to use your hand again after diagnosis (occupational therapy).  Medicines for pain and inflammation. This may include medicine that is injected into your wrist.  A wrist splint.  Surgery. Follow these instructions at home: If you have a splint:  Wear the splint as told by your health care provider. Remove it only as told by your health care provider.  Loosen the splint if your fingers tingle, become numb, or turn cold and blue.  Keep the splint clean.  If the splint is not waterproof: ? Do not let it get wet. ? Cover it with a watertight covering when you take a bath or shower. Managing pain, stiffness, and swelling   If directed, put ice on the painful area: ? If you have a removable splint, remove it as told by your health care provider. ? Put ice in a plastic bag. ? Place a towel between your skin and the bag. ? Leave the ice on for 20 minutes, 2-3 times per day. General instructions  Take over-the-counter and prescription medicines only as told by your health care provider.  Rest your wrist from any activity that may be causing your pain. If your condition is work related, talk with your employer about  changes that can be made, such as getting a  wrist pad to use while typing.  Do any exercises as told by your health care provider, physical therapist, or occupational therapist.  Keep all follow-up visits as told by your health care provider. This is important. Contact a health care provider if:  You have new symptoms.  Your pain is not controlled with medicines.  Your symptoms get worse. Get help right away if:  You have severe numbness or tingling in your wrist or hand. Summary  Carpal tunnel syndrome is a condition that causes pain in your hand and arm.  It is usually caused by repeated wrist motions.  Lifestyle changes and medicines are used to treat carpal tunnel syndrome. Surgery may be recommended.  Follow your health care provider's instructions about wearing a splint, resting from activity, keeping follow-up visits, and calling for help. This information is not intended to replace advice given to you by your health care provider. Make sure you discuss any questions you have with your health care provider. Document Released: 05/23/2000 Document Revised: 10/02/2017 Document Reviewed: 10/02/2017 Elsevier Patient Education  2020 [employer].

## 2019-02-10 NOTE — Progress Notes (Signed)
Subjective:    Patient ID: Lauren Olson, female    DOB: 1970/10/31, 48 y.o.   MRN: [id number]  47y/o African-American established female pt c/o R wrist pain x8 weeks. Works in Catering manageracking, repetitive motion makes pain worse. Normally wears a full arm compression sleeve but forgot it at home this morning.  Working at [employer] less than 6 months new employee. Pain has been waking her up at night feels like pins and needles typically 0300. Also taking Ibuprofen at home left over for her headaches but ran out and would like refill.  Wrapped her arm with saran wrap today to try and mimic compression arm sleeve.  Sometimes notices arm/finger/hands swollen. Denied trauma/bruising/rash/hx of carpal tunnel.  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 Denied weakness in hands/fingers/arms/dropping items/unable to make fist or difficulty opening jars/doors.      Review of Systems  Constitutional: Negative for activity change, appetite change, chills, diaphoresis, fatigue and fever.  HENT: Negative for trouble swallowing and voice change.   Eyes: Negative for photophobia and visual disturbance.  Respiratory: Negative for cough, choking, chest tightness, shortness of breath, wheezing and stridor.   Cardiovascular: Negative for chest pain and palpitations.  Gastrointestinal: Negative for diarrhea, nausea and vomiting.  Endocrine: Negative for cold intolerance and heat intolerance.  Genitourinary: Negative for difficulty urinating.  Musculoskeletal: Positive for arthralgias and myalgias. Negative for back pain, gait problem, joint swelling, neck pain and neck stiffness.  Skin: Negative for color change, pallor, rash and wound.  Allergic/Immunologic: Negative for environmental allergies and food allergies.  Neurological: Positive for numbness. Negative for dizziness, tremors, seizures,  syncope, facial asymmetry, speech difficulty, weakness, light-headedness and headaches.  Hematological: Negative for adenopathy. Does not bruise/bleed easily.  Psychiatric/Behavioral: Positive for sleep disturbance. Negative for agitation and confusion.       Objective:   Physical Exam Vitals signs and nursing note reviewed.  Constitutional:      General: She is awake. She is not in acute distress.    Appearance: Normal appearance. She is well-developed and well-groomed. She is not ill-appearing, toxic-appearing or diaphoretic.  HENT:     Head: Normocephalic and atraumatic.     Right Ear: External ear normal.     Left Ear: External ear normal.     Nose: Nose normal.     Mouth/Throat:     Mouth: Mucous membranes are moist.  Eyes:     General: Lids are normal. No visual field deficit.    Extraocular Movements: Extraocular movements intact.     Conjunctiva/sclera: Conjunctivae normal.     Pupils: Pupils are equal, round, and reactive to light.  Neck:     Musculoskeletal: Normal range of motion and neck supple. Normal range of motion. No edema, erythema, neck rigidity, crepitus, injury, pain with movement, torticollis, spinous process tenderness or muscular tenderness.     Trachea: Trachea and phonation normal.  Cardiovascular:     Rate and Rhythm: Normal rate and regular rhythm.     Pulses: Normal pulses.          Radial pulses are 2+ on the right side and 2+ on the left side.     Heart sounds: Normal heart sounds.  Pulmonary:     Effort: Pulmonary effort is normal. No respiratory distress.     Breath sounds: Normal breath sounds and air entry. No stridor, decreased air movement or  transmitted upper airway sounds. No decreased breath sounds, wheezing, rhonchi or rales.     Comments: Wearing cloth mask due to covid 19 pandemic; no cough observed in exam room; spoke full sentences without difficulty Abdominal:     General: Abdomen is flat.     Palpations: Abdomen is soft.   Musculoskeletal: Normal range of motion.        General: No swelling, tenderness or deformity.     Right shoulder: Normal.     Left shoulder: Normal.     Right elbow: Normal.    Left elbow: Normal.     Right wrist: She exhibits normal range of motion, no tenderness, no bony tenderness, no swelling, no effusion and no crepitus.     Left wrist: She exhibits normal range of motion, no tenderness, no bony tenderness, no swelling, no effusion, no crepitus, no deformity and no laceration.     Right hip: Normal.     Left hip: Normal.     Right knee: Normal.     Left knee: Normal.     Cervical back: Normal.     Thoracic back: Normal.     Lumbar back: Normal.     Right forearm: Normal.     Left forearm: Normal.     Right hand: She exhibits normal range of motion, no tenderness, no bony tenderness, normal two-point discrimination, normal capillary refill, no deformity, no laceration and no swelling. Normal sensation noted. Normal strength noted. She exhibits no finger abduction, no thumb/finger opposition and no wrist extension trouble.     Left hand: She exhibits normal range of motion, no tenderness, no bony tenderness, normal two-point discrimination, normal capillary refill, no deformity, no laceration and no swelling. Normal sensation noted. Normal strength noted. She exhibits no finger abduction, no thumb/finger opposition and no wrist extension trouble.     Right lower leg: No edema.     Left lower leg: No edema.     Comments: Pain intermittent with arom with and without resistance bilateral wrists distal forearm radiating into hands; no trigger point tenderness or bony tenderness/ecchymosis/rash/swelling/deformity/crepitus/erythema  Lymphadenopathy:     Head:     Right side of head: No submental, submandibular, tonsillar, preauricular, posterior auricular or occipital adenopathy.     Left side of head: No submental, submandibular, tonsillar, preauricular, posterior auricular or occipital  adenopathy.     Cervical: No cervical adenopathy.     Right cervical: No superficial cervical adenopathy.    Left cervical: No superficial cervical adenopathy.  Skin:    General: Skin is warm and dry.     Capillary Refill: Capillary refill takes less than 2 seconds.     Coloration: Skin is not ashen, cyanotic, jaundiced, mottled, pale or sallow.     Findings: No abrasion, abscess, acne, bruising, burn, ecchymosis, erythema, laceration, lesion, petechiae, rash or wound.     Nails: There is no clubbing.   Neurological:     General: No focal deficit present.     Mental Status: She is alert and oriented to person, place, and time. Mental status is at baseline.     GCS: GCS eye subscore is 4. GCS verbal subscore is 5. GCS motor subscore is 6.     Cranial Nerves: Cranial nerves are intact. No cranial nerve deficit, dysarthria or facial asymmetry.     Sensory: Sensation is intact. No sensory deficit.     Motor: Motor function is intact. No weakness, tremor, atrophy, abnormal muscle tone or seizure activity.  Coordination: Coordination is intact. Coordination normal.     Gait: Gait is intact. Gait normal.  Psychiatric:        Attention and Perception: Attention and perception normal.        Mood and Affect: Mood and affect normal.        Speech: Speech normal.        Behavior: Behavior normal. Behavior is cooperative.        Thought Content: Thought content normal.        Cognition and Memory: Cognition and memory normal.        Judgment: Judgment normal.       Some paresthesias a few seconds after tinnels performed right "electric shocks" into hand/fingers; left negative.  Full strength upper and lower extremities equal 5/5; negative phalens test bilaterally; fitted and distributed bilateral wrist splint size medium from clinic stock.      Assessment & Plan:  A-bilateral wrist pain acute initial visit and paresthesias bilateral hands  P-Continue discussed work tips with coworkers.   Discussed condition will not resolve overnight but will take time following plan improving each week as long as her work duties are not ramped up to a higher level also.  Her body needs time to adjust to this new normal routine and her dept has been very busy since she was hired.  Refilled motrin 800mg  po TID prn pain #30 RF0 take with food dispensed from [employer] to patient. Avoid ibuprofen/advil/aleve/naproxen/naprosyn OTC while taking prescription strength motrin I gave her today.   Follow up 2 weeks for re-evaluation.  Remove splints to perform AROM/Stretches 3 times per day and to shower.  May wash splint and air dry or dry with blowdryer do not put in dryer.  May spray with lysol or bleach spray to disinfect.  Sleep with splints.  Consider biofreeze gel topical QID given 4 UD from clinic stock.  Tylenol if too soon to redose motrin; tylenol 1000mg  po q6h prn pain given 4 UD from clinic stock.  Cryotherapy 15 minutes QID and elevate hands if swelling after work/sleeping.  Given 1 reusable ice pack from clinic stock.  [employer]care handouts printed on wrist stretch (hug, reverse eagle, wrist stretch), carpal tunnel syndrome, wrist splint and given to patient. Consider formal PT/orthopedics if fails home PT/wrist splints trial/cryotherapy/nsaid.   Patient verbalized understanding information/instructions, agreed with plan of care and had no further questions at this time.

## 2019-04-01 ENCOUNTER — Other Ambulatory Visit: Payer: Self-pay | Admitting: Obstetrics and Gynecology

## 2019-04-01 DIAGNOSIS — Z1231 Encounter for screening mammogram for malignant neoplasm of breast: Secondary | ICD-10-CM

## 2019-04-05 ENCOUNTER — Encounter: Payer: Self-pay | Admitting: Registered Nurse

## 2019-04-05 ENCOUNTER — Ambulatory Visit: Payer: Self-pay | Admitting: Registered Nurse

## 2019-04-05 ENCOUNTER — Other Ambulatory Visit: Payer: Self-pay

## 2019-04-05 VITALS — BP 98/75 | HR 77

## 2019-04-05 DIAGNOSIS — M7989 Other specified soft tissue disorders: Secondary | ICD-10-CM

## 2019-04-05 DIAGNOSIS — M79641 Pain in right hand: Secondary | ICD-10-CM

## 2019-04-05 MED ORDER — IBUPROFEN 800 MG PO TABS: 800.0000 mg | ORAL_TABLET | Freq: Three times a day (TID) | ORAL | 0 refills | Status: AC | PRN

## 2019-04-05 NOTE — Patient Instructions (Signed)
Hand Pain Many things can cause hand pain. Some common causes are:  An injury.  Repeating the same movement with your hand over and over (overuse).  Osteoporosis.  Arthritis.  Lumps in the tendons or joints of the hand and wrist (ganglion cysts).  Nerve compression syndromes (carpal tunnel syndrome).  Inflammation of the tendons (tendinitis).  Infection. Follow these instructions at home: Pay attention to any changes in your symptoms. Take these actions to help with your discomfort: Managing pain, stiffness, and swelling   Take over-the-counter and prescription medicines only as told by your health care provider.  Wear a hand splint or support as told by your health care provider.  If directed, put ice on the affected area: ? Put ice in a plastic bag. ? Place a towel between your skin and the bag. ? Leave the ice on for 20 minutes, 2-3 times a day. Activity  Take breaks from repetitive activity often.  Avoid activities that make your pain worse.  Minimize stress on your hands and wrists as much as possible.  Do stretches or exercises as told by your health care provider.  Do not do activities that make your pain worse. Contact a health care provider if:  Your pain does not get better after a few days of self-care.  Your pain gets worse.  Your pain affects your ability to do your daily activities. Get help right away if:  Your hand becomes warm, red, or swollen.  Your hand is numb or tingling.  Your hand is extremely swollen or deformed.  Your hand or fingers turn white or blue.  You cannot move your hand, wrist, or fingers. Summary  Many things can cause hand pain.  Contact your health care provider if your pain does not get better after a few days of self care.  Minimize stress on your hands and wrists as much as possible.  Do not do activities that make your pain worse. This information is not intended to replace advice given to you by your  health care provider. Make sure you discuss any questions you have with your health care provider. Document Released: 06/22/2015 Document Revised: 02/19/2018 Document Reviewed: 02/19/2018 Elsevier Patient Education  2020 [employer].

## 2019-04-05 NOTE — Progress Notes (Signed)
Subjective:    Patient ID: Lauren Olson, female    DOB: [birth year], 48 y.o.   MRN: [id number]  48y/o African-American established female pt last seen 02/10/2019 for bilateral wrist pain. Was taking Ibuprofen 800mg  po TID prn pain, given splints for both wrists and instructed to f/u in 2 weeks. She forgot to return for f/u but pain had improved so she was not concerned with it anymore. Splints began hurting to use during work hours when lifting boxes it dug the hard plastic of splint into palm of hand and she noticed swelling at night but not when she woke up so she stopped wearing them. She bought a nerf ball and has been sleeping with it and when she wakes up in the middle of the night she squeezes it.  She picked up motrin 800mg  po TID refill last week from [facility] and it has been working well for her migraines and wrist pain.  Unsure how long she has been without them. Now c/o R anterior wrist pain extending into palm, flaring for past 2 weeks. Endorses taking Ibuprofen prn for HAs. Brought splint with her to clinic but is not wearing it.   Denied tingling or numbness/weakness in hands/fingers.  Works in [employer].  Boxes cannot weigh more than 22 lbs per patient.       Review of Systems  Constitutional: Negative for activity change, appetite change, chills, diaphoresis, fatigue and fever.  HENT: Negative for trouble swallowing and voice change.   Eyes: Negative for photophobia and visual disturbance.  Respiratory: Negative for cough, shortness of breath, wheezing and stridor.   Cardiovascular: Negative for chest pain.  Gastrointestinal: Negative for diarrhea, nausea and vomiting.  Endocrine: Negative for cold intolerance and heat intolerance.  Musculoskeletal: Positive for arthralgias and myalgias. Negative for back pain, gait problem, joint swelling, neck pain and neck stiffness.  Allergic/Immunologic: Negative for food allergies.  Neurological: Positive for  headaches. Negative for dizziness and facial asymmetry.  Hematological: Negative for adenopathy. Does not bruise/bleed easily.  Psychiatric/Behavioral: Positive for sleep disturbance. Negative for agitation and confusion.       Objective:   Physical Exam Constitutional:      General: She is awake. She is not in acute distress.    Appearance: Normal appearance. She is well-developed and well-groomed. She is not ill-appearing, toxic-appearing or diaphoretic.  HENT:     Head: Normocephalic and atraumatic.     Jaw: There is normal jaw occlusion.     Right Ear: Hearing and external ear normal.     Left Ear: Hearing and external ear normal.     Nose: Nose normal.     Mouth/Throat:     Mouth: Mucous membranes are moist.     Pharynx: Oropharynx is clear.  Eyes:     General: Lids are normal. Vision grossly intact. Gaze aligned appropriately. No visual field deficit.    Extraocular Movements: Extraocular movements intact.     Conjunctiva/sclera: Conjunctivae normal.     Pupils: Pupils are equal, round, and reactive to light.  Neck:     Musculoskeletal: Normal range of motion and neck supple. Normal range of motion. No edema, erythema, neck rigidity, crepitus, injury, pain with movement or torticollis.     Trachea: Trachea and phonation normal.  Cardiovascular:     Rate and Rhythm: Normal rate and regular rhythm.     Pulses: Normal pulses.          Radial pulses are 2+ on the right  side and 2+ on the left side.     Heart sounds: Normal heart sounds.  Pulmonary:     Effort: Pulmonary effort is normal. No respiratory distress.     Breath sounds: Normal breath sounds and air entry. No stridor, decreased air movement or transmitted upper airway sounds. No decreased breath sounds, wheezing, rhonchi or rales.     Comments: No cough observed in exam room; spoke full sentences without difficulty; wearing cloth mask due to covid 19 pandemic Abdominal:     General: Abdomen is flat.   Musculoskeletal: Normal range of motion.        General: Tenderness present. No swelling or deformity.     Right shoulder: Normal.     Left shoulder: Normal.     Right elbow: Normal.    Left elbow: Normal.     Right wrist: Normal.     Left wrist: Normal.     Right hip: Normal.     Left hip: Normal.     Right knee: Normal.     Left knee: Normal.     Cervical back: Normal.     Thoracic back: Normal.     Lumbar back: Normal.     Right upper arm: Normal.     Left upper arm: Normal.     Right forearm: Normal.     Left forearm: Normal.       Arms:     Right hand: She exhibits tenderness. She exhibits normal range of motion, no bony tenderness, normal two-point discrimination, normal capillary refill, no deformity, no laceration and no swelling. Normal sensation noted. Normal strength noted. She exhibits no finger abduction, no thumb/finger opposition and no wrist extension trouble.     Left hand: Normal.     Right lower leg: No edema.     Left lower leg: No edema.     Comments: Negative Tinnels and phalens tests;  Full strength 5/5 bilateral hands and fingers no swelling noted.  TTP thenar prominance central and snuff box soft tissue; no visual edema noted compared to left and no crepitus or increased tissue firmness with palpation  Lymphadenopathy:     Head:     Right side of head: No submental, submandibular, tonsillar, preauricular, posterior auricular or occipital adenopathy.     Left side of head: No submental, submandibular, tonsillar, preauricular, posterior auricular or occipital adenopathy.     Cervical: No cervical adenopathy.     Right cervical: No superficial cervical adenopathy.    Left cervical: No superficial cervical adenopathy.  Skin:    General: Skin is warm and dry.     Capillary Refill: Capillary refill takes less than 2 seconds.     Coloration: Skin is not ashen, cyanotic, jaundiced, mottled, pale or sallow.     Findings: No abrasion, abscess, acne, bruising,  burn, ecchymosis, erythema, signs of injury, laceration, lesion, petechiae, rash or wound.     Nails: There is no clubbing.   Neurological:     General: No focal deficit present.     Mental Status: She is alert and oriented to person, place, and time. Mental status is at baseline.     GCS: GCS eye subscore is 4. GCS verbal subscore is 5. GCS motor subscore is 6.     Cranial Nerves: Cranial nerves are intact. No cranial nerve deficit, dysarthria or facial asymmetry.     Sensory: Sensation is intact. No sensory deficit.     Motor: Motor function is intact. No weakness, tremor, atrophy, abnormal  muscle tone or seizure activity.     Coordination: Coordination is intact. Coordination normal.     Gait: Gait is intact. Gait normal.     Comments: On/off exam table and in/out of chair without difficulty; gait sure and steady in clinic  Psychiatric:        Attention and Perception: Attention and perception normal.        Mood and Affect: Mood and affect normal.        Speech: Speech normal.        Behavior: Behavior normal. Behavior is cooperative.        Thought Content: Thought content normal.        Cognition and Memory: Cognition and memory normal.        Judgment: Judgment normal.    Fitted and dispensed medium neoprene wrist brace from clinic soft.  Soft with velcro adjustments to help pad hand and support wrist when packing boxes.       Assessment & Plan:  A-right hand pain and swelling subsequent visit  P-Normal exam except soft tissue tenderness right hand thenar aligns with distal end of thumb spica splint plastic not webbing.  Consider wearing gloves when packing to help pad/protect hands also.  stop use of rigid thumb spica wrist splint at work.  Switch to neoprene/velcro at work and wear rigid when sleeping.  Consider cryotherapy 15 minutes QID prn pain/swelling.  May continue squeezing soft foam ball to help with range of motion/decrease swelling at night.  Elevate hand above heart  level to also decrease swelling.  Patient has motrin 800mg  po TID prn pain at home [facility] week to her.  Patient reported it has been working very well for her wrist pain and headaches but hand pain worsens throughout the day due to plastic of brace digging into hand when lifting full 22 lb boxes.  Exitcare handout hand pain.  Patient to follow up in 2 weeks for re-evaluation; sooner if new or worsening symptoms with plan of care.  Patient verbalized understanding information/instructions, agreed with plan of care and had no further questions at this time.

## 2019-05-19 ENCOUNTER — Other Ambulatory Visit: Payer: Self-pay | Admitting: Internal Medicine

## 2019-05-19 ENCOUNTER — Ambulatory Visit
Admission: RE | Admit: 2019-05-19 | Discharge: 2019-05-19 | Disposition: A | Discharge status: Home / Self Care | Payer: PRIVATE HEALTH INSURANCE | Source: Ambulatory Visit | Attending: Obstetrics and Gynecology | Admitting: Obstetrics and Gynecology

## 2019-05-19 ENCOUNTER — Other Ambulatory Visit: Payer: Self-pay

## 2019-05-19 DIAGNOSIS — Z1231 Encounter for screening mammogram for malignant neoplasm of breast: Secondary | ICD-10-CM

## 2019-05-23 ENCOUNTER — Ambulatory Visit: Payer: Self-pay

## 2019-05-23 NOTE — Telephone Encounter (Signed)
Noted thanks °

## 2019-05-23 NOTE — Telephone Encounter (Signed)
Patient called stating that she has right wrist pain that shoots up her arm.  She states her fingers are numb and tingly and swell. She states she has been treated in the past for this with a brace and a then a band that goes around her wrist but these have been ineffective. She states her job is repetitive twisting motion with her wrist.  She also lifts boxes of about 20lbs.  She has been taking motrin but it does not help. Care advice read to patient.She verbalized understanding. Call transferred to office for scheduling.  Reason for Disposition . Numbness (i.e., loss of sensation) in hand or fingers  Answer Assessment - Initial Assessment Questions 1. ONSET: "When did the pain start?"     3 weeks 2. LOCATION: "Where is the pain located?"    Rt arm 3. PAIN: "How bad is the pain?" (Scale 1-10; or mild, moderate, severe)   - MILD (1-3): doesn't interfere with normal activities   - MODERATE (4-7): interferes with normal activities (e.g., work or school) or awakens from sleep   - SEVERE (8-10): excruciating pain, unable to do any normal activities, unable to hold a cup of water    Wakes from sleep 4. WORK OR EXERCISE: "Has there been any recent work or exercise that involved this part of the body?"    Wrist work with work and lifting 5. CAUSE: "What do you think is causing the arm pain?"     Wrist is where the pain starts 6. OTHER SYMPTOMS: "Do you have any other symptoms?" (e.g., neck pain, swelling, rash, fever, numbness, weakness)    Swelling in fingers and had both hands 7. PREGNANCY: "Is there any chance you are pregnant?" "When was your last menstrual period?"     No No menses  Protocols used: ARM PAIN-A-AH

## 2019-05-24 ENCOUNTER — Encounter: Payer: Self-pay | Admitting: Internal Medicine

## 2019-05-24 ENCOUNTER — Other Ambulatory Visit: Payer: Self-pay

## 2019-05-24 ENCOUNTER — Ambulatory Visit (INDEPENDENT_AMBULATORY_CARE_PROVIDER_SITE_OTHER): Payer: PRIVATE HEALTH INSURANCE | Admitting: Internal Medicine

## 2019-05-24 VITALS — BP 110/82 | HR 70 | Temp 97.8°F | Ht 65.0 in | Wt 153.0 lb

## 2019-05-24 DIAGNOSIS — M25531 Pain in right wrist: Secondary | ICD-10-CM | POA: Diagnosis not present

## 2019-05-24 NOTE — Patient Instructions (Addendum)
We will get you the note for lifting restrictions to let the arms rest to see if they improve.   You can try turmeric over the counter to help the arms and wrist.

## 2019-05-24 NOTE — Progress Notes (Signed)
   Subjective:   Patient ID: Lauren Olson, female    DOB: 1970-06-30, 48 y.o.   MRN: [id number]  HPI The patient is a 48 YO female coming in for right wrist and hand pain with numbness and tingling. 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 She does repetitive motions. Has seen her employee health several times over the last several months this has started and they gave her rigid brace then softer brace but neither has helped with her pain. She is getting pain in the wrist and shooting pains up to the shoulder and some numbness in the wrist. Overall worsening. She is taking ibuprofen otc which helps sometimes but not others. Using heat/ice as well. Denies fevers or chills. Denies injury but does have overuse. The job she is doing she was not hired to do due to covid.   Review of Systems  Constitutional: Negative.   HENT: Negative.   Eyes: Negative.   Respiratory: Negative for cough, chest tightness and shortness of breath.   Cardiovascular: Negative for chest pain, palpitations and leg swelling.  Gastrointestinal: Negative for abdominal distention, abdominal pain, constipation, diarrhea, nausea and vomiting.  Musculoskeletal: Positive for arthralgias and myalgias.  Skin: Negative.   Neurological: Negative.   Psychiatric/Behavioral: Negative.     Objective:  Physical Exam Constitutional:      Appearance: She is well-developed.  HENT:     Head: Normocephalic and atraumatic.  Cardiovascular:     Rate and Rhythm: Normal rate and regular rhythm.  Pulmonary:     Effort: Pulmonary effort is normal. No respiratory distress.     Breath sounds: Normal breath sounds. No wheezing or rales.  Abdominal:     General: Bowel sounds are normal. There is no distension.     Palpations: Abdomen is soft.     Tenderness: There is no abdominal tenderness. There is no rebound.  Musculoskeletal:        General: Tenderness present.     Cervical back: Normal range of  motion.     Comments: Pain right wrist and tinel positive  Skin:    General: Skin is warm and dry.  Neurological:     Mental Status: She is alert and oriented to person, place, and time.     Coordination: Coordination normal.     Vitals:   05/24/19 1508  BP: 110/82  Pulse: 70  Temp: 97.8 F (36.6 C)  TempSrc: Axillary  SpO2: 98%  Weight: 153 lb (69.4 kg)  Height: 5\' 5"  (1.651 m)    This visit occurred during the SARS-CoV-2 public health emergency.  Safety protocols were in place, including screening questions prior to the visit, additional usage of staff PPE, and extensive cleaning of exam room while observing appropriate contact time as indicated for disinfecting solutions.   Assessment & Plan:  Visit time 25 minutes: greater than 50% of that time was spent in face to face counseling and coordination of care with the patient: counseled about prior treatments, job description, motion of the arm during job, options for moving forward

## 2019-05-25 ENCOUNTER — Telehealth: Payer: Self-pay

## 2019-05-25 DIAGNOSIS — M25531 Pain in right wrist: Secondary | ICD-10-CM | POA: Insufficient documentation

## 2019-05-25 NOTE — Assessment & Plan Note (Signed)
Suspect due to employment. Will give lifting restriction and 4-6 weeks rest to see if she has improvement in symptoms. If no improvement will need to see hand specialist.

## 2019-05-25 NOTE — Telephone Encounter (Signed)
Copied from [place] [id number]. Topic: General - Other >> May 25, 2019  2:52 PM Alanda Slim E wrote: Reason for CRM: Pts HR wanted more instructions/ restrictions. They wanted to know how long/ Pt asked to speak with Dr. Charlynne Cousins nurse/ Pt stated she was given a new job that followed the instructions/ please advise

## 2019-05-25 NOTE — Telephone Encounter (Signed)
Work needs to know how long or a time frame for her lifting limits. Was placed in another department to have a lot less lifting. Patient was asking if she can get 6 weeks for the restrictions to make sure she is better before going back.  Go back to her normal job February 2nd.

## 2019-05-26 NOTE — Telephone Encounter (Signed)
Patient informed of MD response and stated understanding  

## 2019-05-26 NOTE — Telephone Encounter (Signed)
I would recommend 4-6 weeks then re-evaluation.

## 2019-06-15 ENCOUNTER — Telehealth: Payer: Self-pay

## 2019-06-15 NOTE — Telephone Encounter (Signed)
Appt scheduled on  06/16/19.    Lauren Olson

## 2019-06-15 NOTE — Telephone Encounter (Signed)
She is supposed to return as instructed at last visit to determine this information. It will be based on response to treatment.

## 2019-06-15 NOTE — Telephone Encounter (Signed)
Pt was informed earlier that an OV would be needed for evaluation per PCP. Please schedule. Thanks!

## 2019-06-15 NOTE — Telephone Encounter (Signed)
Copied from CRM [id number]. Topic: General - Other >> Jun 14, 2019  4:26 PM Dalphine Handing A wrote: Patients employers benefit manager called and stated that she needs a callback as soon as possible in regards to the patient getting an updated doctors note. Manager wants to know if patient is going to be released back to full duty or if her work restriction continue; for how long. Manager stated that the work note that was received and dated 05/24/2019 did not have an end date. Best contact number [phone] and Fax number [phone]

## 2019-06-15 NOTE — Telephone Encounter (Signed)
Patient called in stating benefits manager is still needing to have the 6 week restriction note faxed over, not in a voicemail. Please advise. Fax is [phone].

## 2019-06-15 NOTE — Telephone Encounter (Signed)
Called benefits manager has been informed by VM of PCP details below.  Pt has been informed that an OV would be needed to be released to go back to work and stated she would call back to schedule.

## 2019-06-16 ENCOUNTER — Ambulatory Visit (INDEPENDENT_AMBULATORY_CARE_PROVIDER_SITE_OTHER): Payer: PRIVATE HEALTH INSURANCE | Admitting: Family Medicine

## 2019-06-16 ENCOUNTER — Ambulatory Visit (INDEPENDENT_AMBULATORY_CARE_PROVIDER_SITE_OTHER): Payer: PRIVATE HEALTH INSURANCE | Admitting: Internal Medicine

## 2019-06-16 ENCOUNTER — Other Ambulatory Visit: Payer: Self-pay

## 2019-06-16 ENCOUNTER — Encounter: Payer: Self-pay | Admitting: Internal Medicine

## 2019-06-16 ENCOUNTER — Encounter: Payer: Self-pay | Admitting: Family Medicine

## 2019-06-16 VITALS — Ht 65.0 in | Wt 157.0 lb

## 2019-06-16 VITALS — BP 120/78 | HR 69 | Temp 98.0°F | Ht 65.0 in | Wt 157.0 lb

## 2019-06-16 DIAGNOSIS — M25532 Pain in left wrist: Secondary | ICD-10-CM

## 2019-06-16 DIAGNOSIS — M25531 Pain in right wrist: Secondary | ICD-10-CM

## 2019-06-16 DIAGNOSIS — M778 Other enthesopathies, not elsewhere classified: Secondary | ICD-10-CM | POA: Diagnosis not present

## 2019-06-16 DIAGNOSIS — G5603 Carpal tunnel syndrome, bilateral upper limbs: Secondary | ICD-10-CM | POA: Diagnosis not present

## 2019-06-16 MED ORDER — AMBULATORY NON FORMULARY MEDICATION: 0 refills | Status: DC

## 2019-06-16 NOTE — Progress Notes (Signed)
   Subjective:   Patient ID: Lauren Olson, female    DOB: [birth year], 49 y.o.   MRN: [id number]  HPI The patient is a 49 YO female coming in for concerns about right wrist pain. She was having pain due to working and was not hired for the job she is doing now due to covid-19. She was put on lifting restrictions at our last visit 3 weeks ago to see if this would help. She is having much improved pain in the right wrist. The low lifting job no longer exists now that holidays are done and she needs a different job otherwise she will not be able to work. She feels she is able to lift slightly more weight now but still not the weight level of >25 pounds which is where she had the worsening pain.  Also having new left hand pain and numbness and struggles to make a fist in the morning for some time. Feels she has a bony prominence which has grown in the last several weeks. Sore to touch. Wants evaluated as well.   Fax note to [phone]  Review of Systems  Constitutional: Negative.   HENT: Negative.   Eyes: Negative.   Respiratory: Negative for cough, chest tightness and shortness of breath.   Cardiovascular: Negative for chest pain, palpitations and leg swelling.  Gastrointestinal: Negative for abdominal distention, abdominal pain, constipation, diarrhea, nausea and vomiting.  Musculoskeletal: Positive for arthralgias and myalgias.  Skin: Negative.   Neurological: Negative.   Psychiatric/Behavioral: Negative.     Objective:  Physical Exam Constitutional:      Appearance: She is well-developed.  HENT:     Head: Normocephalic and atraumatic.  Cardiovascular:     Rate and Rhythm: Normal rate and regular rhythm.  Pulmonary:     Effort: Pulmonary effort is normal. No respiratory distress.     Breath sounds: Normal breath sounds. No wheezing or rales.  Abdominal:     General: Bowel sounds are normal. There is no distension.     Palpations: Abdomen is soft.     Tenderness: There is no  abdominal tenderness. There is no rebound.  Musculoskeletal:        General: Tenderness present.     Cervical back: Normal range of motion.     Comments: Prominent bony prominence left ulnar more so than right ulnar. Sore to touch.   Skin:    General: Skin is warm and dry.  Neurological:     Mental Status: She is alert and oriented to person, place, and time.     Coordination: Coordination normal.     Vitals:   06/16/19 1420  BP: 120/78  Pulse: 69  Temp: 98 F (36.7 C)  TempSrc: Oral  SpO2: 99%  Weight: 157 lb (71.2 kg)  Height: 5\' 5"  (1.651 m)    This visit occurred during the SARS-CoV-2 public health emergency.  Safety protocols were in place, including screening questions prior to the visit, additional usage of staff PPE, and extensive cleaning of exam room while observing appropriate contact time as indicated for disinfecting solutions.   Assessment & Plan:  Visit time 25 minutes in face to face communication with patient and coordination of care, additional 10 minutes spent in record review, coordination or care, ordering tests, communicating/referring to other healthcare professionals, documenting in medical records all on the same day of the visit for total time 35 minutes spent on the visit.

## 2019-06-16 NOTE — Patient Instructions (Addendum)
Thank you for coming in today.  Use the voltaren gel over the counter up to 4x daily for pain.  Use the wrist wraps at work as needed Use the wrist brace with sleep for carpal tunnel.  Do the exercises Kirt Boys went over with you.   Please perform the exercise program that we have prepared for you and gone over in detail on a daily basis.  In addition to the handout you were provided you can access your program through: [email or web address]   Your unique program code is:  [id number]    Extensor Carpi Ulnaris Tendinitis Tendinitis is inflammation in the tissues that connect muscles to bone (tendons). The extensor carpi ulnaris (ECU) tendon is located on the back of the wrist, on the side that is closest to the small finger. ECU tendinitis will cause pain on the small finger side of the wrist or forearm. It is a common sports-related injury. What are the causes? This condition may be caused by:  Putting repeated stress on your wrist.  Using the wrong technique while playing sports like golf or tennis.  Weakening of the tendon due to age or a health problem.  Injury or trauma. What increases the risk? You are more likely to develop this condition if:  You play sports that involve repeated motions of the wrist, such as golf, tennis, or rugby.  You are 50 years of age or older.  You have an inflammatory condition, such as rheumatoid arthritis. What are the signs or symptoms? Symptoms of this condition include:  Pain along the small finger side of your wrist or forearm when moving your wrist.  A constant ache on the small finger side of your wrist.  Feeling like your grip is weaker than usual.  A popping, snapping, or tearing feeling in your wrist.  Wrist swelling. How is this diagnosed? This condition may be diagnosed based on:  Your symptoms and medical history.  A physical exam. During the exam, your health care provider will check the strength of your grip and may ask  you to move your wrist. Your health care provider may also order an MRI or ultrasound to check for tears in your ligaments, muscles, or tendons. How is this treated? Treatment for this condition may include:  Resting your wrist.  Wearing a splint on your wrist.  Medicines to help with pain and swelling.  Applying heat or ice to the area to ease pain.  Exercises to help you maintain strength and range of motion in your wrist (physical therapy).  Therapy to help with everyday activities (occupational therapy). If these treatments do not help, you may need an injection of an anti-inflammatory medicine (steroid) mixed with a numbing medicine (local anesthetic). Follow these instructions at home: If you have a splint:   Wear it as told by your health care provider. Remove it only as told by your health care provider.  Loosen the splint if your fingers tingle, become numb, or turn cold and blue.  Keep the splint clean.  If the splint is not waterproof: ? Do not let it get wet. ? Cover it with a watertight covering when you take a bath or shower. ? Remove it for bathing and washing your hand only if instructed by your health care provider. Managing pain, stiffness, and swelling      If directed, put ice on the injured area. ? If you have a removable splint, remove it as told by your health care provider. ? Put ice in  a plastic bag. ? Place a towel between your skin and the bag. ? Leave the ice on for 20 minutes, 2-3 times a day.  If directed, apply heat to the affected area as often as told by your health care provider. Use the heat source that your health care provider recommends, such as a moist heat pack or a heating pad. ? If you have a removable splint, remove it as told by your health care provider. ? Place a towel between your skin and the heat source. ? Leave the heat on for 15-20 minutes. ? Remove the heat if your skin turns bright red. This is especially important if  you are unable to feel pain, heat, or cold. You may have a greater risk of getting burned.  Move your fingers often to reduce stiffness and swelling.  Raise (elevate) the injured area above the level of your heart while you are sitting or lying down. Activity  Avoid activities that cause pain or put strain on your wrist for as long as told by your health care provider.  Do not use your injured hand to support your body weight until your health care provider says that you can.  Return to your normal activities as told by your health care provider. Ask your health care provider what activities are safe for you.  Do exercises as told by your health care provider.  Ask your health care provider when it is safe to drive if you have a splint on your wrist. General instructions  Take over-the-counter and prescription medicines only as told by your health care provider.  Do not use any products that contain nicotine or tobacco, such as cigarettes, e-cigarettes, and chewing tobacco. These can delay healing. If you need help quitting, ask your health care provider.  Keep all follow-up visits as told by your health care provider. This is important. How is this prevented?  Warm up and stretch before being active.  Cool down and stretch after being active.  Give your body time to rest between periods of activity.  Use equipment that fits you and is in good condition.  If you play golf or racket sports, focus on proper form or try to improve your technique.  Maintain physical fitness, including: ? Strength. ? Flexibility. ? Endurance. Contact a health care provider if:  Your pain, tenderness, or swelling gets worse, even if you have had treatment. Get help right away if:  Your pain becomes severe.  You cannot move your wrist. Summary  Extensor carpi ulnaris tendinitis is a condition that will cause pain on the small finger side of the wrist or forearm.  Treatment for this  condition may include wearing a splint, taking pain medicines, applying heat or ice, and physical or occupational therapy.  Avoid activities that cause pain or put strain on your wrist for as long as told by your health care provider.  Return to your normal activities as told by your health care provider. This information is not intended to replace advice given to you by your health care provider. Make sure you discuss any questions you have with your health care provider. Document Revised: 09/21/2018 Document Reviewed: 07/15/2018 Elsevier Patient Education  2020 [employer].    Carpal Tunnel Syndrome  Carpal tunnel syndrome is a condition that causes pain in your hand and arm. The carpal tunnel is a narrow area located on the palm side of your wrist. Repeated wrist motion or certain diseases may cause swelling within the tunnel.  This swelling pinches the main nerve in the wrist (median nerve). What are the causes? This condition may be caused by:  Repeated wrist motions.  Wrist injuries.  Arthritis.  A cyst or tumor in the carpal tunnel.  Fluid buildup during pregnancy. Sometimes the cause of this condition is not known. What increases the risk? The following factors may make you more likely to develop this condition:  Having a job, such as being a Research scientist (life sciences), that requires you to repeatedly move your wrist in the same motion.  Being a woman.  Having certain conditions, such as: ? Diabetes. ? Obesity. ? An underactive thyroid (hypothyroidism). ? Kidney failure. What are the signs or symptoms? Symptoms of this condition include:  A tingling feeling in your fingers, especially in your thumb, index, and middle fingers.  Tingling or numbness in your hand.  An aching feeling in your entire arm, especially when your wrist and elbow are bent for a long time.  Wrist pain that goes up your arm to your shoulder.  Pain that goes down into your palm or fingers.  A  weak feeling in your hands. You may have trouble grabbing and holding items. Your symptoms may feel worse during the night. How is this diagnosed? This condition is diagnosed with a medical history and physical exam. You may also have tests, including:  Electromyogram (EMG). This test measures electrical signals sent by your nerves into the muscles.  Nerve conduction study. This test measures how well electrical signals pass through your nerves.  Imaging tests, such as X-rays, ultrasound, and MRI. These tests check for possible causes of your condition. How is this treated? This condition may be treated with:  Lifestyle changes. It is important to stop or change the activity that caused your condition.  Doing exercise and activities to strengthen your muscles and bones (physical therapy).  Learning how to use your hand again after diagnosis (occupational therapy).  Medicines for pain and inflammation. This may include medicine that is injected into your wrist.  A wrist splint.  Surgery. Follow these instructions at home: If you have a splint:  Wear the splint as told by your health care provider. Remove it only as told by your health care provider.  Loosen the splint if your fingers tingle, become numb, or turn cold and blue.  Keep the splint clean.  If the splint is not waterproof: ? Do not let it get wet. ? Cover it with a watertight covering when you take a bath or shower. Managing pain, stiffness, and swelling   If directed, put ice on the painful area: ? If you have a removable splint, remove it as told by your health care provider. ? Put ice in a plastic bag. ? Place a towel between your skin and the bag. ? Leave the ice on for 20 minutes, 2-3 times per day. General instructions  Take over-the-counter and prescription medicines only as told by your health care provider.  Rest your wrist from any activity that may be causing your pain. If your condition is work  related, talk with your employer about changes that can be made, such as getting a wrist pad to use while typing.  Do any exercises as told by your health care provider, physical therapist, or occupational therapist.  Keep all follow-up visits as told by your health care provider. This is important. Contact a health care provider if:  You have new symptoms.  Your pain is not controlled with medicines.  Your symptoms get worse. Get help right away if:  You have severe numbness or tingling in your wrist or hand. Summary  Carpal tunnel syndrome is a condition that causes pain in your hand and arm.  It is usually caused by repeated wrist motions.  Lifestyle changes and medicines are used to treat carpal tunnel syndrome. Surgery may be recommended.  Follow your health care provider's instructions about wearing a splint, resting from activity, keeping follow-up visits, and calling for help. This information is not intended to replace advice given to you by your health care provider. Make sure you discuss any questions you have with your health care provider. Document Revised: 10/02/2017 Document Reviewed: 10/02/2017 Elsevier Patient Education  2020 [employer].

## 2019-06-16 NOTE — Progress Notes (Signed)
   Subjective:    I'm seeing this patient as a consultation for:  Dr. Okey Dupre. Note will be routed back to referring provider/PCP.  CC: B wrist pain (L>R)  HPI: Pt is a 49 y/o female presenting w/ c/o B wrist pain, L>R.  Pt states that pain began on the R wrist but has transitoned to her L ulnar-sided wrist and hand.  She works in [employer] [place], doing a lot of repetitive activity.  She rates her L wrist pain at a 6/10 that she describes as sharp.  She has difficulty making a fist w/ her L hand in the mornings.  She had similar problems w/ making a fist on the R hand which has improved but is still having difficulty w/ gripping on the R.  Pt notes numbness in the tips of her fingers in B hands.  She has tried a brace on her R hand/wrist but was not able to tolerate the brace.  She is not doing any specific treatment for her wrist pain currently.  Pain is located mostly at the dorsal ulnar wrist.  Past medical history, Surgical history, Family history, Social history, Allergies, and medications have been entered into the medical record, reviewed.   Review of Systems: No headache, visual changes, nausea, vomiting, diarrhea, constipation, dizziness, abdominal pain, skin rash, fevers, chills, night sweats, weight loss, swollen lymph nodes, body aches, joint swelling, muscle aches, chest pain, shortness of breath, mood changes, visual or auditory hallucinations.   Objective:    General: Well Developed, well nourished, and in no acute distress.  Neuro/Psych: Alert and oriented x3, extra-ocular muscles intact, able to move all 4 extremities, sensation grossly intact. Skin: Warm and dry, no rashes noted.  Respiratory: Not using accessory muscles, speaking in full sentences, trachea midline.  Cardiovascular: Pulses palpable, no extremity edema. Abdomen: Does not appear distended. MSK: Right wrist normal-appearing Normal range of motion. Tender to palpation at ulnar styloid. Normal  wrist strength.  Pain with resisted extension and ulnar deviation. Positive Tinel's at carpal tunnel. Pulses cap refill and sensation and grip strength are intact.  Left wrist normal-appearing Normal range of motion. Tender to palpation around ulnar styloid. Normal wrist strength. Pain with resisted wrist extension ulnar deviation. Positive Tinel's at carpal tunnel. Pulses and capillary refill and sensation are intact as well as grip strength.    Impression and Recommendations:    Assessment and Plan: 49 y.o. female with multifactorial bilateral wrist pain.  Dominant finding is pain around ulnar styloid consistent with extensor carpi ulnaris tendinitis.  Plan to treat with soft wrist bracing, eccentric exercises, Voltaren gel.  Additionally will write letter to modify job duties which should be very helpful.  Check back in a month or so or sooner if needed.    Addition patient has findings consistent with carpal tunnel syndrome.  Treat with cock-up wrist splints.  Recheck if not improving.  Again job duty change should help..  Meds ordered this encounter  Medications  . AMBULATORY NON FORMULARY MEDICATION    Sig: Soft wrist wrap/brace BL. Use wrist BL .  Disp 2.    Dispense:  1 each    Refill:  0    Discussed warning signs or symptoms. Please see discharge instructions. Patient expresses understanding.   The above documentation has been reviewed and is accurate and complete Clementeen Graham

## 2019-06-16 NOTE — Patient Instructions (Signed)
We have faxed the note and you will see the sports medicine doctor for the left wrist today.

## 2019-06-17 ENCOUNTER — Encounter: Payer: Self-pay | Admitting: Family Medicine

## 2019-06-17 DIAGNOSIS — M25532 Pain in left wrist: Secondary | ICD-10-CM | POA: Insufficient documentation

## 2019-06-17 NOTE — Assessment & Plan Note (Signed)
New work note given. Advised to continue with rest to help this heal.

## 2019-06-17 NOTE — Assessment & Plan Note (Signed)
Refer to sports medicine for evaluation and treatment.

## 2019-08-18 ENCOUNTER — Encounter: Payer: Self-pay | Admitting: Family Medicine

## 2019-08-18 ENCOUNTER — Telehealth: Payer: Self-pay | Admitting: Family Medicine

## 2019-08-18 ENCOUNTER — Telehealth: Payer: Self-pay | Admitting: Internal Medicine

## 2019-08-18 NOTE — Telephone Encounter (Signed)
   Patient calling to request letter  (Dr Denyse Amass) detailing restrictions on her job be updated with more details. Please call

## 2019-08-18 NOTE — Telephone Encounter (Signed)
Letter updated.  Letter was sent through [facility] and a copy was printed and will be available for pickup at the front desk.  Alternatively I can fax it or mail it if needed.  Lauren Olson

## 2019-08-18 NOTE — Telephone Encounter (Signed)
Please contact patient and try to get some details of exactly what the letter needs to say. If there is still a lot of confusion I will contact the patient myself.  But try to get a little more formation of count.

## 2019-08-18 NOTE — Telephone Encounter (Signed)
Pt has not been seen since 06/16/19.  Would you like to f/u w/ her before you write an updated letter or do you just want to call and figure out what she needs new letter to say?

## 2019-08-18 NOTE — Telephone Encounter (Signed)
Pt has started training for her new job and she needs the restriction letter that Dr. Denyse Amass wrote to be amended to the following: Able to lift and move up to 25 lbs frequently and able to carry over 25 lbs  Please call when ready and she will pickup letter

## 2019-08-18 NOTE — Telephone Encounter (Signed)
Contacted pt and informed her that her letter has been sent through MyChart and is also ready for pick-up at the front desk if she so chooses.

## 2019-08-18 NOTE — Progress Notes (Signed)
Letter written updating lifting restrictions as outlined

## 2019-08-18 NOTE — Telephone Encounter (Signed)
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  Thanks, [employer]

## 2020-01-31 ENCOUNTER — Telehealth: Payer: Self-pay | Admitting: Registered Nurse

## 2020-01-31 ENCOUNTER — Encounter: Payer: Self-pay | Admitting: Registered Nurse

## 2020-01-31 MED ORDER — IBUPROFEN 800 MG PO TABS: 800.0000 mg | ORAL_TABLET | Freq: Three times a day (TID) | ORAL | 0 refills | Status: AC | PRN

## 2020-01-31 NOTE — Telephone Encounter (Signed)
Patient requested refill of ibuprofen 800mg  po TID prn pain/headache #30 RF0 from [employer] formulary [employer].  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  Take with food.  Follow up with provider prn new or worsening symptoms.  Patient verbalized understanding information/instructions, agreed with plan of care and had no further questions at this time.

## 2020-12-18 ENCOUNTER — Observation Stay (HOSPITAL_COMMUNITY)
Admission: EM | Admit: 2020-12-18 | Discharge: 2020-12-19 | Disposition: A | Discharge status: Home / Self Care | Payer: BC Managed Care – PPO | Attending: Internal Medicine | Admitting: Internal Medicine

## 2020-12-18 ENCOUNTER — Other Ambulatory Visit: Payer: Self-pay

## 2020-12-18 ENCOUNTER — Emergency Department (HOSPITAL_COMMUNITY): Payer: BC Managed Care – PPO

## 2020-12-18 ENCOUNTER — Encounter (HOSPITAL_COMMUNITY): Payer: Self-pay

## 2020-12-18 DIAGNOSIS — R778 Other specified abnormalities of plasma proteins: Secondary | ICD-10-CM | POA: Insufficient documentation

## 2020-12-18 DIAGNOSIS — M62838 Other muscle spasm: Secondary | ICD-10-CM

## 2020-12-18 DIAGNOSIS — Z28310 Unvaccinated for covid-19: Secondary | ICD-10-CM | POA: Insufficient documentation

## 2020-12-18 DIAGNOSIS — Z20822 Contact with and (suspected) exposure to covid-19: Secondary | ICD-10-CM | POA: Insufficient documentation

## 2020-12-18 DIAGNOSIS — R079 Chest pain, unspecified: Secondary | ICD-10-CM | POA: Diagnosis not present

## 2020-12-18 DIAGNOSIS — R55 Syncope and collapse: Secondary | ICD-10-CM | POA: Diagnosis not present

## 2020-12-18 DIAGNOSIS — M542 Cervicalgia: Secondary | ICD-10-CM | POA: Insufficient documentation

## 2020-12-18 DIAGNOSIS — R7989 Other specified abnormal findings of blood chemistry: Secondary | ICD-10-CM | POA: Diagnosis present

## 2020-12-18 DIAGNOSIS — R0602 Shortness of breath: Secondary | ICD-10-CM | POA: Diagnosis not present

## 2020-12-18 DIAGNOSIS — I214 Non-ST elevation (NSTEMI) myocardial infarction: Secondary | ICD-10-CM | POA: Insufficient documentation

## 2020-12-18 DIAGNOSIS — R748 Abnormal levels of other serum enzymes: Secondary | ICD-10-CM | POA: Diagnosis not present

## 2020-12-18 LAB — I-STAT BETA HCG BLOOD, ED (MC, WL, AP ONLY): I-stat hCG, quantitative: <5 m[IU]/mL (ref <5)

## 2020-12-18 LAB — BASIC METABOLIC PANEL
Anion gap: 8 (ref 5–15)
BUN: 8 mg/dL (ref 6–20)
CO2: 27 mmol/L (ref 22–32)
Calcium: 9.6 mg/dL (ref 8.9–10.3)
Chloride: 104 mmol/L (ref 98–111)
Creatinine, Ser: 0.72 mg/dL (ref 0.44–1.00)
GFR, Estimated: >60 mL/min (ref >60)
Glucose, Bld: 92 mg/dL (ref 70–99)
Potassium: 3.8 mmol/L (ref 3.5–5.1)
Sodium: 139 mmol/L (ref 135–145)

## 2020-12-18 LAB — CBC
HCT: 37.4 % (ref 36.0–46.0)
Hemoglobin: 12.7 g/dL (ref 12.0–15.0)
MCH: 30.9 pg (ref 26.0–34.0)
MCHC: 34 g/dL (ref 30.0–36.0)
MCV: 91 fL (ref 80.0–100.0)
Platelets: 249 10*3/uL (ref 150–400)
RBC: 4.11 MIL/uL (ref 3.87–5.11)
RDW: 11.9 % (ref 11.5–15.5)
WBC: 5.6 10*3/uL (ref 4.0–10.5)
nRBC: 0 % (ref 0.0–0.2)

## 2020-12-18 LAB — RESP PANEL BY RT-PCR (FLU A&B, COVID) ARPGX2
Influenza A by PCR: NEGATIVE
Influenza B by PCR: NEGATIVE
SARS Coronavirus 2 by RT PCR: NEGATIVE

## 2020-12-18 LAB — TROPONIN I (HIGH SENSITIVITY)
Troponin I (High Sensitivity): <2 ng/L (ref <18)
Troponin I (High Sensitivity): <2 ng/L (ref <18)

## 2020-12-18 MED ORDER — ASPIRIN 81 MG PO CHEW
324.0000 mg | CHEWABLE_TABLET | Freq: Once | ORAL | Status: AC
  Administered 2020-12-18: 324 mg via ORAL
  Filled 2020-12-18: qty 4

## 2020-12-18 MED ORDER — IOHEXOL 350 MG/ML SOLN
100.0000 mL | Freq: Once | INTRAVENOUS | Status: AC | PRN
  Administered 2020-12-18: 80 mL via INTRAVENOUS

## 2020-12-18 MED ORDER — SODIUM CHLORIDE (PF) 0.9 % IJ SOLN
INTRAMUSCULAR | Status: AC
  Filled 2020-12-18: qty 100

## 2020-12-18 NOTE — ED Triage Notes (Signed)
Patient states she has been having intermittent left neck pain/tightness that radiates into the left chest and left arm. Patient states the latest episode was at 1050 while at work

## 2020-12-18 NOTE — ED Provider Notes (Signed)
[facility]-EMERGENCY DEPT Provider Note   CSN: [id number] Arrival date & time: 12/18/20  1117     History Chief Complaint  Patient presents with   Neck Pain   Chest Pain    Lauren Olson is a 50 y.o. female with a past medical history of migraines, presenting to the ED with a chief complaint of chest pain and neck pain.  Approximately 1 week ago started having intermittent left-sided neck pain that would radiate down to her chest.  No specific aggravating or alleviating factor.  She did not take any medications to help with the symptoms.  Today when she was at work approximately 5 hours ago started experiencing worsening left-sided neck pain with paresthesias down her left arm.  She was having trouble breathing at the time as well.  This lasted for about an hour and resolved without intervention.  She denied any headache, blurry vision, numbness, injury or trauma.  No shortness of breath.  No history of DVT, PE, MI, recent immobilization, OCP use, prior neck surgeries or fever.  States that she has a slight pain in the left side of her neck now but otherwise her symptoms have resolved.  HPI     Past Medical History:  Diagnosis Date   Migraine    UTI (urinary tract infection)     Patient Active Problem List   Diagnosis Date Noted   Left wrist pain 06/17/2019   Right wrist pain 05/25/2019   Adjustment disorder with mixed anxiety and depressed mood 02/19/2017   Episodic cluster headache, not intractable 02/19/2017   Atopic dermatitis 02/19/2017   Routine general medical examination at a health care facility 06/07/2015    History reviewed. No pertinent surgical history.   OB History   No obstetric history on file.     Family History  Problem Relation Age of Onset   Cancer Mother    Hypertension Mother    Hypertension Father    Hypertension Brother    Breast cancer Neg Hx     Social History   Tobacco Use   Smoking status: Never   Smokeless  tobacco: Never  Vaping Use   Vaping Use: Never used  Substance Use Topics   Alcohol use: No    Alcohol/week: 0.0 standard drinks   Drug use: No    Home Medications Prior to Admission medications   Medication Sig Start Date End Date Taking? Authorizing Provider  AMBULATORY NON FORMULARY MEDICATION Soft wrist wrap/brace BL. Use wrist BL .  Disp 2. 06/16/19   Rodolph Bong, MD  valACYclovir (VALTREX) 500 MG tablet Take 500 mg by mouth daily. 12/08/19   [provider]    Allergies    Patient has no known allergies.  Review of Systems   Review of Systems  HENT:  Negative for ear pain, rhinorrhea, sneezing and sore throat.   Eyes:  Negative for photophobia and visual disturbance.  Respiratory:  Negative for cough, chest tightness, shortness of breath and wheezing.   Skin:  Negative for rash.   Physical Exam Updated Vital Signs BP (!) 117/59 (BP Location: Left Arm)   Pulse 73   Temp 97.8 F (36.6 C) (Oral)   Resp 16   Ht 5\' 5"  (1.651 m)   Wt 63.5 kg   SpO2 100%   BMI 23.30 kg/m   Physical Exam Vitals and nursing note reviewed.  Constitutional:      General: She is not in acute distress.    Appearance: She is  well-developed.  HENT:     Head: Normocephalic and atraumatic.     Nose: Nose normal.  Eyes:     General: No scleral icterus.       Right eye: No discharge.        Left eye: No discharge.     Conjunctiva/sclera: Conjunctivae normal.     Pupils: Pupils are equal, round, and reactive to light.  Neck:      Comments: No meningeal signs.  Normal range of motion of neck.  No midline tenderness. Cardiovascular:     Rate and Rhythm: Normal rate and regular rhythm.     Heart sounds: Normal heart sounds. No murmur heard.   No friction rub. No gallop.  Pulmonary:     Effort: Pulmonary effort is normal. No respiratory distress.     Breath sounds: Normal breath sounds.  Abdominal:     General: Bowel sounds are normal. There is no distension.     Palpations:  Abdomen is soft.     Tenderness: There is no abdominal tenderness. There is no guarding.  Musculoskeletal:        General: Normal range of motion.     Cervical back: Normal range of motion and neck supple.     Comments: 2+ DP and 2+ radial pulse noted bilaterally.  No erythema, edema or calf tenderness noted bilaterally.  Skin:    General: Skin is warm and dry.     Findings: No rash.  Neurological:     Mental Status: She is alert and oriented to person, place, and time.     Motor: No abnormal muscle tone.     Coordination: Coordination normal.     Comments: No facial asymmetry.  Equal grip strength bilaterally.    ED Results / Procedures / Treatments   Labs (all labs ordered are listed, but only abnormal results are displayed) Labs Reviewed  TROPONIN I (HIGH SENSITIVITY) - Abnormal; Notable for the following components:      Result Value   Troponin I (High Sensitivity) 69 (*)    All other components within normal limits  RESP PANEL BY RT-PCR (FLU A&B, COVID) ARPGX2  BASIC METABOLIC PANEL  CBC  I-STAT BETA HCG BLOOD, ED ([facility], [facility], [facility] ONLY)  TROPONIN I (HIGH SENSITIVITY)  TROPONIN I (HIGH SENSITIVITY)    EKG EKG Interpretation  Date/Time:  Tuesday December 18 2020 16:45:39 EDT Ventricular Rate:  65 PR Interval:  198 QRS Duration: 83 QT Interval:  379 QTC Calculation: 394 R Axis:   56 Text Interpretation: Sinus rhythm Consider left atrial enlargement ST elev, probable normal early repol pattern no acute ischemia Confirmed by Pieter Partridge ([id number]) on 12/18/2020 4:52:14 PM  Radiology DG Chest 2 View  Result Date: 12/18/2020 CLINICAL DATA:  Chest pain EXAM: CHEST - 2 VIEW COMPARISON:  None. FINDINGS: The heart size and mediastinal contours are within normal limits. Both lungs are clear. The visualized skeletal structures are unremarkable. IMPRESSION: No acute abnormality of the lungs. Electronically Signed   By: Lauralyn Primes M.D.   On: 12/18/2020 12:24   CT Angio Neck W and/or Wo  Contrast  Result Date: 12/18/2020 CLINICAL DATA:  Intermittent left neck pain radiating into left chest and arm EXAM: CT ANGIOGRAPHY NECK TECHNIQUE: Multidetector CT imaging of the neck was performed using the standard protocol during bolus administration of intravenous contrast. Multiplanar CT image reconstructions and MIPs were obtained to evaluate the vascular anatomy. Carotid stenosis measurements (when applicable) are obtained utilizing NASCET criteria, using the distal  internal carotid diameter as the denominator. CONTRAST:  80mL OMNIPAQUE IOHEXOL 350 MG/ML SOLN COMPARISON:  None. FINDINGS: Aortic arch: Great vessel origins are patent. Right carotid system: Patent. No stenosis or evidence of dissection. Left carotid system: Patent.  No stenosis or evidence of dissection. Vertebral arteries: Patent. Right vertebral artery is dominant. No stenosis or evidence of dissection. Skeleton: No significant abnormality. Other neck: Unremarkable. Upper chest: No apical lung mass. IMPRESSION: Patent anterior and posterior circulations in the neck. No stenosis or evidence of dissection. Electronically Signed   By: Guadlupe SpanishPraneil  Patel M.D.   On: 12/18/2020 17:34   CT Angio Chest PE W and/or Wo Contrast  Result Date: 12/18/2020 CLINICAL DATA:  Chest pain.  Pain radiating to LEFT neck and arm. EXAM: CT ANGIOGRAPHY CHEST WITH CONTRAST TECHNIQUE: Multidetector CT imaging of the chest was performed using the standard protocol during bolus administration of intravenous contrast. Multiplanar CT image reconstructions and MIPs were obtained to evaluate the vascular anatomy. CONTRAST:  80mL OMNIPAQUE IOHEXOL 350 MG/ML SOLN COMPARISON:  None. FINDINGS: Cardiovascular: No filling defects within the pulmonary arteries to suggest acute pulmonary embolism. No contour abnormality of the thoracic aorta. Great vessels normal. No pericardial fluid. No mediastinal hematoma. Mediastinum/Nodes: No axillary or supraclavicular adenopathy. No  mediastinal or hilar adenopathy. No pericardial fluid. Esophagus normal. Lungs/Pleura: No pulmonary infarction. No pneumonia. No pleural fluid. No pneumothorax Upper Abdomen: Limited view of the liver, kidneys, pancreas are unremarkable. Normal adrenal glands. Musculoskeletal: No aggressive osseous lesion. Review of the MIP images confirms the above findings. IMPRESSION: 1. No evidence acute pulmonary embolism. 2. No acute findings of the aorta. 3. No acute pulmonary parenchymal findings. Electronically Signed   By: Genevive BiStewart  Edmunds M.D.   On: 12/18/2020 18:13    Procedures Procedures   Medications Ordered in ED Medications  iohexol (OMNIPAQUE) 350 MG/ML injection 100 mL (80 mLs Intravenous Contrast Given 12/18/20 1701)  sodium chloride (PF) 0.9 % injection (  Given by Other 12/18/20 1826)  aspirin chewable tablet 324 mg (324 mg Oral Given 12/18/20 1812)    ED Course  I have reviewed the triage vital signs and the nursing notes.  Pertinent labs & imaging results that were available during my care of the patient were reviewed by me and considered in my medical decision making (see chart for details).  Clinical Course as of 12/18/20 1857  Tue Dec 18, 2020  1612 Troponin I (High Sensitivity)(!): 69 [HK]  1613 Initial troponin <2. [HK]  1741 CT Angio Neck W and/or Wo Contrast No evidence of dissection or other abnormalities. [HK]  [id number] Consult with Dr. Antoine PocheHochrein, on-call cardiologist.  Reviewed patient's case but [facility] with elective consult to cardiology as needed.  Will give aspirin but will hold off on heparin at this time as she is chest pain-free [HK]  1826 CT Angio Chest PE W and/or Wo Contrast No evidence of PE or aortic abnormality [HK]  1856 Troponin I (High Sensitivity): <2 [HK]    Clinical Course User Index [HK] Dietrich PatesKhatri, Shantea Poulton, PA-C   MDM Rules/Calculators/A&P                          50 year old female presenting to the ED with a chief  complaint of neck pain and chest pain.  Symptoms have been intermittent for the past week.  This morning while she was at work around 10 AM her symptoms got worse.  Had worsening left-sided neck pain  that radiated down her chest and had paresthesias on her left arm.  No headache, vision changes, numbness or shortness of breath at the time.  Symptoms resolved spontaneously after about 1 hour.  No similar symptoms in the past.  On exam patient without any neurological deficits.  No facial asymmetry.  Pupils are equal and reactive to light bilaterally.  She does have point tenderness of the left trapezius musculature but no objective signs of numbness.  Equal grip strength bilaterally.  Equal and intact distal pulses of bilateral upper and lower extremities.  She has no lower extremity edema, erythema or calf tenderness that would concern me for DVT.  Her vital signs are within normal limits, she is not tachycardic, tachypneic or hypoxic.  She is speaking complete sentences without signs of respiratory distress.  EKG here shows normal sinus rhythm, normal EKG, no ischemic changes, no STEMI.  Chest x-ray without any acute findings.  Lab work including CBC, BMP and troponin x1 unremarkable.  hCG is negative.   -Troponin elevated at 69.  Repeat EKG shows no ischemic changes or concerning findings.  Due to constellation of symptoms as well as the significant elevation in troponin, CT angio of the neck and chest were done to rule out dissection as a cause of this.  Both CTAa were negative for acute abnormality.  I spoke to Dr. Antoine Poche, on-call cardiologist.  He recommends overnight observation to the hospitalist service to trend troponins.  I have ordered aspirin but will hold off on heparin at this time as she is chest pain-free.    Portions of this note were generated with Scientist, clinical (histocompatibility and immunogenetics). Dictation errors may occur despite best attempts at proofreading.   Final Clinical Impression(s) / ED  Diagnoses Final diagnoses:  Muscle spasm  Elevated troponin I level    Rx / DC Orders ED Discharge Orders     None        Dietrich Pates, PA-C 12/18/20 1857    Koleen Distance, MD 12/18/20 [phone]

## 2020-12-18 NOTE — ED Notes (Signed)
Patient transported to CT 

## 2020-12-18 NOTE — H&P (Signed)
Lauren Olson NTZ:[id number] DOB: [birth year] DOA: 12/18/2020     PCP: Myrlene Broker, MD   Outpatient Specialists:  NONE    Patient arrived to ER on 12/18/20 at 1117 Referred by Attending Therisa Doyne, MD   Patient coming from: home Lives alone,     Chief Complaint:   Chief Complaint  Patient presents with   Neck Pain   Chest Pain    HPI: Lauren Olson is a 50 y.o. female with medical history significant of cluster headaches    Presented with few days of intermittent neck on the left side pain as well as chest pain.  Radiating to left arm and chest and some tingling.  Had a significant episode at 10:50 AM was at work.  He presented to emergency department with chest pain/neck pain lasted for about an hour or so.  Not associated shortness of breath Reports chest pain feeling sharp.  Also feels like a muscle spasm in her neck.  It does worsen a bit of palpation as well.  Her pain has resolved the time of arrival to emergency department with syncope otherwise No prior history of DVT PE or MI. No fever no shortness of breath  Does not smoke or drink alcohol  Has  NOt been vaccinated against COVID    Initial COVID TEST  NEGATIVE   Lab Results  Component Value Date   SARSCOV2NAA NEGATIVE 12/18/2020       While in ER: Initial troponin was within normal limits but second troponin went up to 69 EKG without changes cardiology was consulted recommended admission for observation no need for heparin at this time cardiology will see him in the morning if continues to have worsening symptoms or rising troponin we will see her sooner CTA chest negative for PE   ED Triage Vitals  Enc Vitals Group     BP 12/18/20 1133 137/74     Pulse Rate 12/18/20 1133 70     Resp 12/18/20 1133 18     Temp 12/18/20 1133 98.3 F (36.8 C)     Temp Source 12/18/20 1133 Oral     SpO2 12/18/20 1133 100 %     Weight 12/18/20 1146 140 lb (63.5 kg)     Height 12/18/20 1146 5\' 5"   (1.651 m)     Head Circumference --      Peak Flow --      Pain Score 12/18/20 1145 4     Pain Loc --      Pain Edu? --      Excl. in GC? --   TMAX(24)@     _________________________________________ Significant initial  Findings: Abnormal Labs Reviewed  TROPONIN I (HIGH SENSITIVITY) - Abnormal; Notable for the following components:      Result Value   Troponin I (High Sensitivity) 69 (*)    All other components within normal limits   ____________________________________________ Ordered    CXR -  NON acute    CTA chest - nonacute, no PE,   no evidence of infiltrate   _________________________ Troponin 69 ECG: Ordered Personally reviewed by me showing: HR : 72 Rhythm: NSR,    no evidence of ischemic changes QTC 405  Repeat EKG heart rate 65 QTC 394 EKG showing probable early repull pattern no acute ischemia ___________  The recent clinical data is shown below. Vitals:   12/18/20 1530 12/18/20 1600 12/18/20 1630 12/18/20 1804  BP: 121/78 111/72 119/73 (!) 117/59  Pulse: 65 64 64 73  Resp:  15 16 15 16   Temp:    97.8 F (36.6 C)  TempSrc:    Oral  SpO2: 100% 100% 100% 100%  Weight:      Height:         WBC     Component Value Date/Time   WBC 5.6 12/18/2020 1205   LYMPHSABS 1.6 10/07/2018 0915   EOSABS 0.4 10/07/2018 0915   BASOSABS 0.0 10/07/2018 0915      UA   not ordered     _______________________________________________________ ER Provider Called: Cardiology   Dr. Antoine PocheHochrein They Recommend admit to medicine   Will see in AM    _______________________________________________ Hospitalist was called for admission for and possible NSTEMI versus elevated troponin in the setting of musculoskeletal chest pain  The following Work up has been ordered so far:  Orders Placed This Encounter  Procedures   Resp Panel by RT-PCR (Flu A&B, Covid) Nasopharyngeal Swab   DG Chest 2 View   CT Angio Neck W and/or Wo Contrast   CT Angio Chest PE W and/or Wo Contrast    Basic metabolic panel   CBC   Document Height and Actual Weight   Cardiac monitoring   Saline Lock IV, Maintain IV access   Cardiac monitoring   Cardiac monitoring   Inpatient consult to Cardiology   Inpatient consult to Cardiology   Consult to hospitalist   Pulse oximetry, continuous   I-Stat beta hCG blood, ED   EKG 12-Lead   ED EKG within 10 minutes   Repeat EKG   Place in observation (patient's expected length of stay will be less than 2 midnights)     Following Medications were ordered in ER: Medications  iohexol (OMNIPAQUE) 350 MG/ML injection 100 mL (80 mLs Intravenous Contrast Given 12/18/20 1701)  sodium chloride (PF) 0.9 % injection (  Given by Other 12/18/20 1826)  aspirin chewable tablet 324 mg (324 mg Oral Given 12/18/20 1812)        Consult Orders  (From admission, onward)           Start     Ordered   12/18/20 1828  Consult to hospitalist  Once       Provider:  (Not yet assigned)  Question Answer Comment  Place call to: [facility]   Reason for Consult Admit      12/18/20 1828              OTHER Significant initial  Findings:  labs showing:     Recent Labs  Lab 12/18/20 1205  NA 139  K 3.8  CO2 27  GLUCOSE 92  BUN 8  CREATININE 0.72  CALCIUM 9.6    Cr  stable,    Lab Results  Component Value Date   CREATININE 0.72 12/18/2020   CREATININE 0.67 10/07/2018   CREATININE 0.66 05/27/2018    No results for input(s): AST, ALT, ALKPHOS, BILITOT, PROT, ALBUMIN in the last 168 hours. Lab Results  Component Value Date   CALCIUM 9.6 12/18/2020   PHOS 3.1 10/07/2018          Plt: Lab Results  Component Value Date   PLT 249 12/18/2020          Recent Labs  Lab 12/18/20 1205  WBC 5.6  HGB 12.7  HCT 37.4  MCV 91.0  PLT 249    HG/HCT   stable,     Component Value Date/Time   HGB 12.7 12/18/2020 1205   HGB 12.7 10/07/2018 0915   HCT  37.4 12/18/2020 1205   HCT 37.5 10/07/2018 0915   MCV 91.0 12/18/2020 1205    MCV 88 10/07/2018 0915      Radiological Exams on Admission: DG Chest 2 View  Result Date: 12/18/2020 CLINICAL DATA:  Chest pain EXAM: CHEST - 2 VIEW COMPARISON:  None. FINDINGS: The heart size and mediastinal contours are within normal limits. Both lungs are clear. The visualized skeletal structures are unremarkable. IMPRESSION: No acute abnormality of the lungs. Electronically Signed   By: Lauralyn Primes M.D.   On: 12/18/2020 12:24   CT Angio Neck W and/or Wo Contrast  Result Date: 12/18/2020 CLINICAL DATA:  Intermittent left neck pain radiating into left chest and arm EXAM: CT ANGIOGRAPHY NECK TECHNIQUE: Multidetector CT imaging of the neck was performed using the standard protocol during bolus administration of intravenous contrast. Multiplanar CT image reconstructions and MIPs were obtained to evaluate the vascular anatomy. Carotid stenosis measurements (when applicable) are obtained utilizing NASCET criteria, using the distal internal carotid diameter as the denominator. CONTRAST:  15mL OMNIPAQUE IOHEXOL 350 MG/ML SOLN COMPARISON:  None. FINDINGS: Aortic arch: Great vessel origins are patent. Right carotid system: Patent. No stenosis or evidence of dissection. Left carotid system: Patent.  No stenosis or evidence of dissection. Vertebral arteries: Patent. Right vertebral artery is dominant. No stenosis or evidence of dissection. Skeleton: No significant abnormality. Other neck: Unremarkable. Upper chest: No apical lung mass. IMPRESSION: Patent anterior and posterior circulations in the neck. No stenosis or evidence of dissection. Electronically Signed   By: Guadlupe Spanish M.D.   On: 12/18/2020 17:34   CT Angio Chest PE W and/or Wo Contrast  Result Date: 12/18/2020 CLINICAL DATA:  Chest pain.  Pain radiating to LEFT neck and arm. EXAM: CT ANGIOGRAPHY CHEST WITH CONTRAST TECHNIQUE: Multidetector CT imaging of the chest was performed using the standard protocol during bolus administration of  intravenous contrast. Multiplanar CT image reconstructions and MIPs were obtained to evaluate the vascular anatomy. CONTRAST:  34mL OMNIPAQUE IOHEXOL 350 MG/ML SOLN COMPARISON:  None. FINDINGS: Cardiovascular: No filling defects within the pulmonary arteries to suggest acute pulmonary embolism. No contour abnormality of the thoracic aorta. Great vessels normal. No pericardial fluid. No mediastinal hematoma. Mediastinum/Nodes: No axillary or supraclavicular adenopathy. No mediastinal or hilar adenopathy. No pericardial fluid. Esophagus normal. Lungs/Pleura: No pulmonary infarction. No pneumonia. No pleural fluid. No pneumothorax Upper Abdomen: Limited view of the liver, kidneys, pancreas are unremarkable. Normal adrenal glands. Musculoskeletal: No aggressive osseous lesion. Review of the MIP images confirms the above findings. IMPRESSION: 1. No evidence acute pulmonary embolism. 2. No acute findings of the aorta. 3. No acute pulmonary parenchymal findings. Electronically Signed   By: Genevive Bi M.D.   On: 12/18/2020 18:13   _______________________________________________________________________________________________________ Latest  Blood pressure (!) 117/59, pulse 73, temperature 97.8 F (36.6 C), temperature source Oral, resp. rate 16, height 5\' 5"  (1.651 m), weight 63.5 kg, SpO2 100 %.   Review of Systems:    Pertinent positives include:   chest pain,  Neck pain Constitutional:  No weight loss, night sweats, Fevers, chills, fatigue, weight loss  HEENT:  No headaches, Difficulty swallowing,Tooth/dental problems,Sore throat,  No sneezing, itching, ear ache, nasal congestion, post nasal drip,  Cardio-vascular:  No Orthopnea, PND, anasarca, dizziness, palpitations.no Bilateral lower extremity swelling  GI:  No heartburn, indigestion, abdominal pain, nausea, vomiting, diarrhea, change in bowel habits, loss of appetite, melena, blood in stool, hematemesis Resp:  no shortness of breath at rest.  No dyspnea on exertion, No  excess mucus, no productive cough, No non-productive cough, No coughing up of blood.No change in color of mucus.No wheezing. Skin:  no rash or lesions. No jaundice GU:  no dysuria, change in color of urine, no urgency or frequency. No straining to urinate.  No flank pain.  Musculoskeletal:  No joint pain or no joint swelling. No decreased range of motion. No back pain.  Psych:  No change in mood or affect. No depression or anxiety. No memory loss.  Neuro: no localizing neurological complaints, no tingling, no weakness, no double vision, no gait abnormality, no slurred speech, no confusion  All systems reviewed and apart from HOPI all are negative _______________________________________________________________________________________________ Past Medical History:   Past Medical History:  Diagnosis Date   Migraine    UTI (urinary tract infection)       History reviewed. No pertinent surgical history.  Social History:  Ambulatory   independently      reports that she has never smoked. She has never used smokeless tobacco. She reports that she does not drink alcohol and does not use drugs.     Family History:   Family History  Problem Relation Age of Onset   Cancer Mother    Hypertension Mother    Hypertension Father    Hypertension Brother    Breast cancer Neg Hx    ______________________________________________________________________________________________ Allergies: No Known Allergies   Prior to Admission medications   Medication Sig Start Date End Date Taking? Authorizing Provider  AMBULATORY NON FORMULARY MEDICATION Soft wrist wrap/brace BL. Use wrist BL .  Disp 2. 06/16/19   Rodolph Bong, MD  valACYclovir (VALTREX) 500 MG tablet Take 500 mg by mouth daily. 12/08/19   [provider]    ___________________________________________________________________________________________________ Physical Exam: Vitals with BMI  12/18/2020 12/18/2020 12/18/2020  Height - - -  Weight - - -  BMI - - -  Systolic 117 119 793  Diastolic 59 73 72  Pulse 73 64 64     1. General:  in No  Acute distress   well   -appearing 2. Psychological: Alert and  Oriented 3. Head/ENT:   Moist  Mucous Membranes                          Head Non traumatic, neck supple                           Poor Dentition 4. SKIN:  decreased Skin turgor,  Skin clean Dry and intact no rash 5. Heart: Regular rate and rhythm no Murmur, no Rub or gallop 6. Lungs:   no wheezes or crackles   7. Abdomen: Soft,  non-tender, Non distended   8. Lower extremities: no clubbing, cyanosis, no  edema 9. Neurologically Grossly intact, moving all 4 extremities equally   10. MSK: Normal range of motion    Chart has been reviewed  ______________________________________________________________________________________________  Assessment/Plan 50 y.o. female with medical history significant of cluster headaches   Admitted for CP and elevated troponin transient   Present on Admission:  Elevated troponin - unclear etiology transient repeat down to <2 Doubt true ACS.  For completion we will obtain echogram and cardiac enzymes monitor on telemetry.  Cardiology is aware   Chest pain -based on description sounds more musculoskeletal no EKG changes to suggest ischemia.  Isolated troponin leak rapidly improved possibly erroneous.  We will continue to monitor for now Chest pain-free currently  Other plan as per orders.  DVT prophylaxis:   Lovenox       Code Status:    Code Status: Not on file FULL CODE   as per patient   I had personally discussed CODE STATUS with patient     Family Communication:   Family not at  Bedside    Disposition Plan:     To home once workup is complete and patient is stable   Following barriers for discharge:                                     Will need consultants to evaluate patient prior to discharge   EXPECT DC tomorrow                       Consults called:  Cardiology aware    Admission status:  ED Disposition     ED Disposition  Admit   Condition  --   Comment  Hospital Area: [facility] [[id number]]  Level of Care: Progressive [102]  Admit to Progressive based on following criteria: CARDIOVASCULAR & THORACIC of moderate stability with acute coronary syndrome symptoms/low risk myocardial infarction/hypertensive urgency/arrhythmias/heart failure potentially compromising stability and stable post cardiovascular intervention patients.  May place patient in observation at [facility] or [facility] if equivalent level of care is available:: No  Covid Evaluation: Asymptomatic Screening Protocol (No Symptoms)  Diagnosis: NSTEMI (non-ST elevated myocardial infarction) [facility]) [[id number]]  Admitting Physician: Therisa Doyne [[id number]]  Attending Physician: Therisa Doyne [[id number]]           Obs    Level of care    tele indefinitely please discontinue once patient no longer qualifies COVID-19 Labs    Lab Results  Component Value Date   SARSCOV2NAA NEGATIVE 12/18/2020     Precautions: admitted as  Covid Negative      PPE: Used by the provider:   N95  eye Goggles,  Gloves    Saylah Ketner 12/19/2020, 12:16 AM    [facility]     after 2 AM please page floor coverage PA If 7AM-7PM, please contact the day team taking care of the patient using [email or web address]   Patient was evaluated in the context of the global COVID-19 pandemic, which necessitated consideration that the patient might be at risk for infection with the SARS-CoV-2 virus that causes COVID-19. Institutional protocols and algorithms that pertain to the evaluation of patients at risk for COVID-19 are in a state of rapid change based on information released by regulatory bodies including the CDC and federal and state organizations. These policies and algorithms were followed during the patient's care.

## 2020-12-18 NOTE — ED Provider Notes (Signed)
Emergency Medicine Provider Triage Evaluation Note  Lauren Olson , a 50 y.o. female  was evaluated in triage.  Pt complains of neck and chest pain.  Symptoms started yesterday.  She states her symptoms are sharp in nature and radiate from the left neck into the left chest.  They are intermittent and when worsening she will have more difficulty turning the neck.  She states that her pain would worsen with palpation.  She had an additional episode this morning while at work which she states made her very anxious and she became short of breath as well.  States that her symptoms have mostly resolved since arrival.  She does note that she recently lost her father and states that she is more stressed than normal.  Denies any numbness, weakness, or visual changes.  No syncope.  Physical Exam  BP 127/73 (BP Location: Left Arm)   Pulse 66   Temp 98.3 F (36.8 C) (Oral)   Resp 17   Ht 5\' 5"  (1.651 m)   Wt 63.5 kg   SpO2 100%   BMI 23.30 kg/m  Gen:   Awake, no distress   Resp:  Normal effort  MSK:   Moves extremities without difficulty  Other:    Medical Decision Making  Medically screening exam initiated at 1:54 PM.  Appropriate orders placed.  Lauren Olson was informed that the remainder of the evaluation will be completed by another provider, this initial triage assessment does not replace that evaluation, and the importance of remaining in the ED until their evaluation is complete.   , PA-C 12/18/20 1356    02/18/21, DO 12/18/20 1534

## 2020-12-19 ENCOUNTER — Observation Stay (HOSPITAL_BASED_OUTPATIENT_CLINIC_OR_DEPARTMENT_OTHER): Payer: BC Managed Care – PPO

## 2020-12-19 ENCOUNTER — Encounter (HOSPITAL_COMMUNITY): Payer: Self-pay | Admitting: Internal Medicine

## 2020-12-19 DIAGNOSIS — R079 Chest pain, unspecified: Secondary | ICD-10-CM | POA: Diagnosis not present

## 2020-12-19 DIAGNOSIS — R0789 Other chest pain: Secondary | ICD-10-CM

## 2020-12-19 DIAGNOSIS — R778 Other specified abnormalities of plasma proteins: Secondary | ICD-10-CM

## 2020-12-19 DIAGNOSIS — R7989 Other specified abnormal findings of blood chemistry: Secondary | ICD-10-CM | POA: Diagnosis present

## 2020-12-19 LAB — CBC WITH DIFFERENTIAL/PLATELET
Abs Immature Granulocytes: 0.01 10*3/uL (ref 0.00–0.07)
Basophils Absolute: 0 10*3/uL (ref 0.0–0.1)
Basophils Relative: 1 %
Eosinophils Absolute: 0.3 10*3/uL (ref 0.0–0.5)
Eosinophils Relative: 5 %
HCT: 37 % (ref 36.0–46.0)
Hemoglobin: 12.4 g/dL (ref 12.0–15.0)
Immature Granulocytes: 0 %
Lymphocytes Relative: 26 %
Lymphs Abs: 1.6 10*3/uL (ref 0.7–4.0)
MCH: 30.4 pg (ref 26.0–34.0)
MCHC: 33.5 g/dL (ref 30.0–36.0)
MCV: 90.7 fL (ref 80.0–100.0)
Monocytes Absolute: 0.5 10*3/uL (ref 0.1–1.0)
Monocytes Relative: 8 %
Neutro Abs: 3.7 10*3/uL (ref 1.7–7.7)
Neutrophils Relative %: 60 %
Platelets: 244 10*3/uL (ref 150–400)
RBC: 4.08 MIL/uL (ref 3.87–5.11)
RDW: 11.9 % (ref 11.5–15.5)
WBC: 6.1 10*3/uL (ref 4.0–10.5)
nRBC: 0 % (ref 0.0–0.2)

## 2020-12-19 LAB — LIPID PANEL
Cholesterol: 147 mg/dL (ref 0–200)
HDL: 55 mg/dL (ref >40)
LDL Cholesterol: 83 mg/dL (ref 0–99)
Total CHOL/HDL Ratio: 2.7 RATIO
Triglycerides: 44 mg/dL (ref <150)
VLDL: 9 mg/dL (ref 0–40)

## 2020-12-19 LAB — COMPREHENSIVE METABOLIC PANEL
ALT: 13 U/L (ref 0–44)
AST: 20 U/L (ref 15–41)
Albumin: 4.1 g/dL (ref 3.5–5.0)
Alkaline Phosphatase: 54 U/L (ref 38–126)
Anion gap: 6 (ref 5–15)
BUN: 9 mg/dL (ref 6–20)
CO2: 27 mmol/L (ref 22–32)
Calcium: 9.3 mg/dL (ref 8.9–10.3)
Chloride: 106 mmol/L (ref 98–111)
Creatinine, Ser: 0.77 mg/dL (ref 0.44–1.00)
GFR, Estimated: >60 mL/min (ref >60)
Glucose, Bld: 85 mg/dL (ref 70–99)
Potassium: 3.9 mmol/L (ref 3.5–5.1)
Sodium: 139 mmol/L (ref 135–145)
Total Bilirubin: 0.8 mg/dL (ref 0.3–1.2)
Total Protein: 7.3 g/dL (ref 6.5–8.1)

## 2020-12-19 LAB — ECHOCARDIOGRAM COMPLETE
AR max vel: 2.07 cm2
AV Area VTI: 2.08 cm2
AV Area mean vel: 1.95 cm2
AV Mean grad: 4 mmHg
AV Peak grad: 6.5 mmHg
Ao pk vel: 1.27 m/s
Area-P 1/2: 3.74 cm2
Height: 65 in
S' Lateral: 2.5 cm
Weight: 2240 oz

## 2020-12-19 LAB — PHOSPHORUS: Phosphorus: 4.7 mg/dL — ABNORMAL HIGH (ref 2.5–4.6)

## 2020-12-19 LAB — HEMOGLOBIN A1C
Hgb A1c MFr Bld: 5.5 % (ref 4.8–5.6)
Mean Plasma Glucose: 111.15 mg/dL

## 2020-12-19 LAB — HIV ANTIBODY (ROUTINE TESTING W REFLEX): HIV Screen 4th Generation wRfx: NONREACTIVE

## 2020-12-19 LAB — TROPONIN I (HIGH SENSITIVITY): Troponin I (High Sensitivity): <2 ng/L (ref <18)

## 2020-12-19 LAB — MAGNESIUM: Magnesium: 2 mg/dL (ref 1.7–2.4)

## 2020-12-19 MED ORDER — SODIUM CHLORIDE 0.9 % IV SOLN
75.0000 mL/h | INTRAVENOUS | Status: AC
  Administered 2020-12-19: 75 mL/h via INTRAVENOUS

## 2020-12-19 MED ORDER — ACETAMINOPHEN 650 MG RE SUPP: 650.0000 mg | Freq: Four times a day (QID) | RECTAL | Status: DC | PRN

## 2020-12-19 MED ORDER — HYDROCODONE-ACETAMINOPHEN 5-325 MG PO TABS: 1.0000 | ORAL_TABLET | ORAL | Status: DC | PRN

## 2020-12-19 MED ORDER — ACETAMINOPHEN 325 MG PO TABS: 650.0000 mg | ORAL_TABLET | Freq: Four times a day (QID) | ORAL | Status: DC | PRN

## 2020-12-19 MED ORDER — ASPIRIN EC 81 MG PO TBEC
81.0000 mg | DELAYED_RELEASE_TABLET | Freq: Every day | ORAL | Status: DC
  Administered 2020-12-19: 81 mg via ORAL
  Filled 2020-12-19: qty 1

## 2020-12-19 MED ORDER — ENOXAPARIN SODIUM 40 MG/0.4ML IJ SOSY
40.0000 mg | PREFILLED_SYRINGE | INTRAMUSCULAR | Status: DC
  Filled 2020-12-19: qty 0.4

## 2020-12-19 NOTE — Discharge Summary (Signed)
Physician Discharge Summary  Lauren Olson ZOX:[id number] DOB: 04-21-1971 DOA: 12/18/2020  PCP: Myrlene Brokerrawford, Elizabeth A, MD  Admit date: 12/18/2020 Discharge date: 12/19/2020  Admitted From: home Disposition:  home  Recommendations for Outpatient Follow-up:  Follow up with PCP in 1-2 weeks  Home Health:no  Equipment/Devices: none  Discharge Condition: Stable Code Status:   Code Status: Full Code Diet recommendation:  Diet Order             Diet Heart Room service appropriate? Yes; Fluid consistency: Thin  Diet effective now           Diet - low sodium heart healthy                    Brief/Interim Summary:    Discharge Diagnoses:   Chest pain: Resolved likely atypical troponins are negative but is spuriously positive few times like with other patients past 24 hours.  Seen by cardiology no further work-up advised underwent echocardiogram.  Okay to discharge home with outpatient follow-up. Patient reports she has been under a lot of stress as her father died few weeks ago and she was very close to him.  Likely stress contributed to the chest pain.  Cluster headache: Continue home meds  Consults: Cardiology  Subjective: Alert awake this morning no chest pain.    Discharge Exam: Vitals:   12/19/20 0930 12/19/20 1030  BP: 102/62 98/67  Pulse: 72 72  Resp: 14 14  Temp:    SpO2: 100% 99%   General: Pt is alert, awake, not in acute distress Cardiovascular: RRR, S1/S2 +, no rubs, no gallops Respiratory: CTA bilaterally, no wheezing, no rhonchi Abdominal: Soft, NT, ND, bowel sounds + Extremities: no edema, no cyanosis  Discharge Instructions  Discharge Instructions     Diet - low sodium heart healthy   Complete by: As directed    Discharge instructions   Complete by: As directed    Please call call MD or return to ER for similar or worsening recurring problem that brought you to hospital or if any recurrent chest pain shortness of breath  or any  fever,nausea/vomiting,abdominal pain, uncontrolled pain, or any other alarming symptoms.  Please follow-up your doctor as instructed in a week time and call the office for appointment.  Please avoid alcohol, smoking, or any other illicit substance and maintain healthy habits including taking your regular medications as prescribed.  You were cared for by a hospitalist during your hospital stay. If you have any questions about your discharge medications or the care you received while you were in the hospital after you are discharged, you can call the unit and ask to speak with the hospitalist on call if the hospitalist that took care of you is not available.  Once you are discharged, your primary care physician will handle any further medical issues. Please note that NO REFILLS for any discharge medications will be authorized once you are discharged, as it is imperative that you return to your primary care physician (or establish a relationship with a primary care physician if you do not have one) for your aftercare needs so that they can reassess your need for medications and monitor your lab values   Increase activity slowly   Complete by: As directed       Allergies as of 12/19/2020   No Known Allergies      Medication List     TAKE these medications    AMBULATORY NON FORMULARY MEDICATION Soft wrist wrap/brace BL. Use wrist  BL .  Disp 2.        Follow-up Information     Myrlene Broker, MD Follow up in 1 week(s).   Specialty: Internal Medicine Contact information: [address] [place] [address] [phone]                No Known Allergies  The results of significant diagnostics from this hospitalization (including imaging, microbiology, ancillary and laboratory) are listed below for reference.    Microbiology: Recent Results (from the past 240 hour(s))  Resp Panel by RT-PCR (Flu A&B, Covid) Nasopharyngeal Swab     Status: None   Collection  Time: 12/18/20  6:29 PM   Specimen: Nasopharyngeal Swab; Nasopharyngeal(NP) swabs in vial transport medium  Result Value Ref Range Status   SARS Coronavirus 2 by RT PCR NEGATIVE NEGATIVE Final    Comment: (NOTE) SARS-CoV-2 target nucleic acids are NOT DETECTED.  The SARS-CoV-2 RNA is generally detectable in upper respiratory specimens during the acute phase of infection. The lowest concentration of SARS-CoV-2 viral copies this assay can detect is 138 copies/mL. A negative result does not preclude SARS-Cov-2 infection and should not be used as the sole basis for treatment or other patient management decisions. A negative result may occur with  improper specimen collection/handling, submission of specimen other than nasopharyngeal swab, presence of viral mutation(s) within the areas targeted by this assay, and inadequate number of viral copies(<138 copies/mL). A negative result must be combined with clinical observations, patient history, and epidemiological information. The expected result is Negative.  Fact Sheet for Patients:  BloggerCourse.com  Fact Sheet for Healthcare Providers:  SeriousBroker.it  This test is no t yet approved or cleared by the [place] FDA and  has been authorized for detection and/or diagnosis of SARS-CoV-2 by FDA under an Emergency Use Authorization (EUA). This EUA will remain  in effect (meaning this test can be used) for the duration of the COVID-19 declaration under Section 564(b)(1) of the Act, 21 U.S.C.section 360bbb-3(b)(1), unless the authorization is terminated  or revoked sooner.       Influenza A by PCR NEGATIVE NEGATIVE Final   Influenza B by PCR NEGATIVE NEGATIVE Final    Comment: (NOTE) The Xpert Xpress SARS-CoV-2/FLU/RSV plus assay is intended as an aid in the diagnosis of influenza from Nasopharyngeal swab specimens and should not be used as a sole basis for treatment. Nasal washings  and aspirates are unacceptable for Xpert Xpress SARS-CoV-2/FLU/RSV testing.  Fact Sheet for Patients: BloggerCourse.com  Fact Sheet for Healthcare Providers: SeriousBroker.it  This test is not yet approved or cleared by the [place] FDA and has been authorized for detection and/or diagnosis of SARS-CoV-2 by FDA under an Emergency Use Authorization (EUA). This EUA will remain in effect (meaning this test can be used) for the duration of the COVID-19 declaration under Section 564(b)(1) of the Act, 21 U.S.C. section 360bbb-3(b)(1), unless the authorization is terminated or revoked.  Performed at [facility], [address], [place], [place] [address]     Procedures/Studies: DG Chest 2 View  Result Date: 12/18/2020 CLINICAL DATA:  Chest pain EXAM: CHEST - 2 VIEW COMPARISON:  None. FINDINGS: The heart size and mediastinal contours are within normal limits. Both lungs are clear. The visualized skeletal structures are unremarkable. IMPRESSION: No acute abnormality of the lungs. Electronically Signed   By: Lauralyn Primes M.D.   On: 12/18/2020 12:24   CT Angio Neck W and/or Wo Contrast  Result Date: 12/18/2020 CLINICAL DATA:  Intermittent left neck pain radiating into left chest and arm EXAM: CT ANGIOGRAPHY NECK TECHNIQUE: Multidetector CT imaging of the neck was performed using the standard protocol during bolus administration of intravenous contrast. Multiplanar CT image reconstructions and MIPs were obtained to evaluate the vascular anatomy. Carotid stenosis measurements (when applicable) are obtained utilizing NASCET criteria, using the distal internal carotid diameter as the denominator. CONTRAST:  76mL OMNIPAQUE IOHEXOL 350 MG/ML SOLN COMPARISON:  None. FINDINGS: Aortic arch: Great vessel origins are patent. Right carotid system: Patent. No stenosis or evidence of dissection. Left carotid system: Patent.  No stenosis or  evidence of dissection. Vertebral arteries: Patent. Right vertebral artery is dominant. No stenosis or evidence of dissection. Skeleton: No significant abnormality. Other neck: Unremarkable. Upper chest: No apical lung mass. IMPRESSION: Patent anterior and posterior circulations in the neck. No stenosis or evidence of dissection. Electronically Signed   By: Guadlupe Spanish M.D.   On: 12/18/2020 17:34   CT Angio Chest PE W and/or Wo Contrast  Result Date: 12/18/2020 CLINICAL DATA:  Chest pain.  Pain radiating to LEFT neck and arm. EXAM: CT ANGIOGRAPHY CHEST WITH CONTRAST TECHNIQUE: Multidetector CT imaging of the chest was performed using the standard protocol during bolus administration of intravenous contrast. Multiplanar CT image reconstructions and MIPs were obtained to evaluate the vascular anatomy. CONTRAST:  42mL OMNIPAQUE IOHEXOL 350 MG/ML SOLN COMPARISON:  None. FINDINGS: Cardiovascular: No filling defects within the pulmonary arteries to suggest acute pulmonary embolism. No contour abnormality of the thoracic aorta. Great vessels normal. No pericardial fluid. No mediastinal hematoma. Mediastinum/Nodes: No axillary or supraclavicular adenopathy. No mediastinal or hilar adenopathy. No pericardial fluid. Esophagus normal. Lungs/Pleura: No pulmonary infarction. No pneumonia. No pleural fluid. No pneumothorax Upper Abdomen: Limited view of the liver, kidneys, pancreas are unremarkable. Normal adrenal glands. Musculoskeletal: No aggressive osseous lesion. Review of the MIP images confirms the above findings. IMPRESSION: 1. No evidence acute pulmonary embolism. 2. No acute findings of the aorta. 3. No acute pulmonary parenchymal findings. Electronically Signed   By: Genevive Bi M.D.   On: 12/18/2020 18:13    Labs: BNP (last 3 results) No results for input(s): BNP in the last 8760 hours. Basic Metabolic Panel: Recent Labs  Lab 12/18/20 1205 12/19/20 0230  NA 139 139  K 3.8 3.9  CL 104 106  CO2  27 27  GLUCOSE 92 85  BUN 8 9  CREATININE 0.72 0.77  CALCIUM 9.6 9.3  MG  --  2.0  PHOS  --  4.7*   Liver Function Tests: Recent Labs  Lab 12/19/20 0230  AST 20  ALT 13  ALKPHOS 54  BILITOT 0.8  PROT 7.3  ALBUMIN 4.1   No results for input(s): LIPASE, AMYLASE in the last 168 hours. No results for input(s): AMMONIA in the last 168 hours. CBC: Recent Labs  Lab 12/18/20 1205 12/19/20 0230  WBC 5.6 6.1  NEUTROABS  --  3.7  HGB 12.7 12.4  HCT 37.4 37.0  MCV 91.0 90.7  PLT 249 244   Cardiac Enzymes: No results for input(s): CKTOTAL, CKMB, CKMBINDEX, TROPONINI in the last 168 hours. BNP: Invalid input(s): POCBNP CBG: No results for input(s): GLUCAP in the last 168 hours. D-Dimer No results for input(s): DDIMER in the last 72 hours. Hgb A1c Recent Labs    12/19/20 0230  HGBA1C 5.5   Lipid Profile Recent Labs    12/19/20 0752  CHOL 147  HDL 55  LDLCALC 83  TRIG 44  CHOLHDL 2.7  Thyroid function studies No results for input(s): TSH, T4TOTAL, T3FREE, THYROIDAB in the last 72 hours.  Invalid input(s): FREET3 Anemia work up No results for input(s): VITAMINB12, FOLATE, FERRITIN, TIBC, IRON, RETICCTPCT in the last 72 hours. Urinalysis    Component Value Date/Time   COLORURINE YELLOW 03/19/2016 1111   APPEARANCEUR CLEAR 03/19/2016 1111   LABSPEC 1.015 03/19/2016 1111   PHURINE 7.0 03/19/2016 1111   GLUCOSEU NEGATIVE 03/19/2016 1111   HGBUR NEGATIVE 03/19/2016 1111   BILIRUBINUR neg 06/19/2016 0931   KETONESUR TRACE (A) 03/19/2016 1111   PROTEINUR 15mg /dL 64/15/8309   UROBILINOGEN 2.0 06/19/2016 0931   UROBILINOGEN 1.0 03/19/2016 1111   NITRITE positive 06/19/2016 0931   NITRITE NEGATIVE 03/19/2016 1111   LEUKOCYTESUR small (1+) (A) 06/19/2016 0931   Sepsis Labs Invalid input(s): PROCALCITONIN,  WBC,  LACTICIDVEN Microbiology Recent Results (from the past 240 hour(s))  Resp Panel by RT-PCR (Flu A&B, Covid) Nasopharyngeal Swab     Status: None    Collection Time: 12/18/20  6:29 PM   Specimen: Nasopharyngeal Swab; Nasopharyngeal(NP) swabs in vial transport medium  Result Value Ref Range Status   SARS Coronavirus 2 by RT PCR NEGATIVE NEGATIVE Final    Comment: (NOTE) SARS-CoV-2 target nucleic acids are NOT DETECTED.  The SARS-CoV-2 RNA is generally detectable in upper respiratory specimens during the acute phase of infection. The lowest concentration of SARS-CoV-2 viral copies this assay can detect is 138 copies/mL. A negative result does not preclude SARS-Cov-2 infection and should not be used as the sole basis for treatment or other patient management decisions. A negative result may occur with  improper specimen collection/handling, submission of specimen other than nasopharyngeal swab, presence of viral mutation(s) within the areas targeted by this assay, and inadequate number of viral copies(<138 copies/mL). A negative result must be combined with clinical observations, patient history, and epidemiological information. The expected result is Negative.  Fact Sheet for Patients:  02/18/21  Fact Sheet for Healthcare Providers:  BloggerCourse.com  This test is no t yet approved or cleared by the [place] FDA and  has been authorized for detection and/or diagnosis of SARS-CoV-2 by FDA under an Emergency Use Authorization (EUA). This EUA will remain  in effect (meaning this test can be used) for the duration of the COVID-19 declaration under Section 564(b)(1) of the Act, 21 U.S.C.section 360bbb-3(b)(1), unless the authorization is terminated  or revoked sooner.       Influenza A by PCR NEGATIVE NEGATIVE Final   Influenza B by PCR NEGATIVE NEGATIVE Final    Comment: (NOTE) The Xpert Xpress SARS-CoV-2/FLU/RSV plus assay is intended as an aid in the diagnosis of influenza from Nasopharyngeal swab specimens and should not be used as a sole basis for treatment.  Nasal washings and aspirates are unacceptable for Xpert Xpress SARS-CoV-2/FLU/RSV testing.  Fact Sheet for Patients: [place]  Fact Sheet for Healthcare Providers: BloggerCourse.com  This test is not yet approved or cleared by the [place]ious[place]ker.[place] FDA and has been authorized for detection and/or diagnosis of SARS-CoV-2 by FDA under an Emergency Use Authorization (EUA). This EUA will remain in effect (meaning this test can be used) for the duration of the COVID-19 declaration under Section 564(b)(1) of the Act, 21 U.S.C. section 360bbb-3(b)(1), unless the authorization is terminated or revoked.  Performed at [facility], [address], [place], [place]      Time coordinating discharge: 25 minutes  SIGNED: [id number], MD  [facility] 12/19/2020, 12:44 PM  If  7PM-7AM, please contact night-coverage [email or web address]

## 2020-12-19 NOTE — ED Notes (Signed)
Pt given meal tray. Family at bedside.

## 2020-12-19 NOTE — Consult Note (Signed)
Cardiology Consultation:   Patient ID: Lauren Olson MRN: [id number]; DOB: November 12, 1970  Admit date: 12/18/2020 Date of Consult: 12/19/2020  PCP:  Lauren Broker, MD   [facility] Providers Cardiologist:  New to HeartCare Click here to update MD or APP on Care Team, Refresh:1}    Patient Profile:   Lauren Olson is a 50 y.o. female with a hx of headaches who is being seen 12/19/2020 for the evaluation of ?chest pain at the request of Dr. Adela Olson.  History of Present Illness:   Lauren Olson has no prior cardiac history or prior testing. No family history of CAD. No known risk factors, tobacco, ETOH, drug use. She recently started an exercise program to help deal with the stress in her life and has felt good while exercising without anginal symptoms. She also did begin to incorporate some weight training and shortly after doing so developed left neck pain last week. It has been intermittent since that time with sensation of numbness in her left arm. We are called for chest pain but she states it's moreso a discomfort in her upper left shoulder area that feels like numbness and denies actual pain, pressure, stabbing or tightness. She had some mild SOB when she became anxious but she felt this was a fear response rather than related to the underlying symptoms. No palpitations, dizziness, syncope reported. Did not try any rx at home - doesn't usually take medicines. Due to persistent symptoms she came to ED for evaluation. Troponin values are inconsistent - <2, 69, <2, <2, 60. CXR NAD. CT angio neck with patent circulations without dissection. CT angio without any acute findings to suggest aortic abnormality or PE. VSS. EKG NSR without acute changes. She feels good this morning.   Past Medical History:  Diagnosis Date   Migraine    UTI (urinary tract infection)     History reviewed. No pertinent surgical history.   Home Medications:  Prior to Admission medications   Medication Sig Start  Date End Date Taking? Authorizing Provider  AMBULATORY NON FORMULARY MEDICATION Soft wrist wrap/brace BL. Use wrist BL .  Disp 2. 06/16/19   Rodolph Bong, MD    Inpatient Medications: Scheduled Meds:  aspirin EC  81 mg Oral Daily   enoxaparin (LOVENOX) injection  40 mg Subcutaneous Q24H   Continuous Infusions:   PRN Meds: acetaminophen **OR** acetaminophen, HYDROcodone-acetaminophen  Allergies:   No Known Allergies  Social History:   Social History   Socioeconomic History   Marital status: Married    Spouse name: Not on file   Number of children: Not on file   Years of education: Not on file   Highest education level: Not on file  Occupational History   Not on file  Tobacco Use   Smoking status: Never   Smokeless tobacco: Never  Vaping Use   Vaping Use: Never used  Substance and Sexual Activity   Alcohol use: No    Alcohol/week: 0.0 standard drinks   Drug use: No   Sexual activity: Not on file  Other Topics Concern   Not on file  Social History Narrative   Not on file   Social Determinants of Health   Financial Resource Strain: Not on file  Food Insecurity: Not on file  Transportation Needs: Not on file  Physical Activity: Not on file  Stress: Not on file  Social Connections: Not on file  Intimate Partner Violence: Not on file    Family History:   Family History  Problem  Relation Age of Onset   Cancer Mother    Hypertension Mother    Hypertension Father    Hypertension Brother    Breast cancer Neg Hx    CAD Neg Hx      ROS:  Please see the history of present illness.  All other ROS reviewed and negative.     Physical Exam/Data:   Vitals:   12/19/20 0800 12/19/20 0915 12/19/20 0930 12/19/20 1030  BP: 137/65  102/62 98/67  Pulse:  84 72 72  Resp:  14 14 14   Temp:      TempSrc:      SpO2: 100% 100% 100% 99%  Weight:      Height:       No intake or output data in the 24 hours ending 12/19/20 1120 Last 3 Weights 12/18/2020 06/16/2019 06/16/2019   Weight (lbs) 140 lb 157 lb 157 lb  Weight (kg) 63.504 kg 71.215 kg 71.215 kg     Body mass index is 23.3 kg/m.  Exam per MD  EKG:  The EKG was personally reviewed and demonstrates: NSR 72bpm, no acute STT changes  Telemetry:  Telemetry was personally reviewed and demonstrates:  Pending review by MD  Relevant CV Studies: None  Laboratory Data:  High Sensitivity Troponin:   Recent Labs  Lab 12/18/20 1205 12/18/20 1507 12/18/20 1723 12/19/20 0230 12/19/20 0752  TROPONINIHS <2 69* <2 <2 60*     Chemistry Recent Labs  Lab 12/18/20 1205 12/19/20 0230  NA 139 139  K 3.8 3.9  CL 104 106  CO2 27 27  GLUCOSE 92 85  BUN 8 9  CREATININE 0.72 0.77  CALCIUM 9.6 9.3  GFRNONAA >60 >60  ANIONGAP 8 6    Recent Labs  Lab 12/19/20 0230  PROT 7.3  ALBUMIN 4.1  AST 20  ALT 13  ALKPHOS 54  BILITOT 0.8   Hematology Recent Labs  Lab 12/18/20 1205 12/19/20 0230  WBC 5.6 6.1  RBC 4.11 4.08  HGB 12.7 12.4  HCT 37.4 37.0  MCV 91.0 90.7  MCH 30.9 30.4  MCHC 34.0 33.5  RDW 11.9 11.9  PLT 249 244   BNPNo results for input(s): BNP, PROBNP in the last 168 hours.  DDimer No results for input(s): DDIMER in the last 168 hours.   Radiology/Studies:  DG Chest 2 View  Result Date: 12/18/2020 CLINICAL DATA:  Chest pain EXAM: CHEST - 2 VIEW COMPARISON:  None. FINDINGS: The heart size and mediastinal contours are within normal limits. Both lungs are clear. The visualized skeletal structures are unremarkable. IMPRESSION: No acute abnormality of the lungs. Electronically Signed   By: 02/18/2021 M.D.   On: 12/18/2020 12:24   CT Angio Neck W and/or Wo Contrast  Result Date: 12/18/2020 CLINICAL DATA:  Intermittent left neck pain radiating into left chest and arm EXAM: CT ANGIOGRAPHY NECK TECHNIQUE: Multidetector CT imaging of the neck was performed using the standard protocol during bolus administration of intravenous contrast. Multiplanar CT image reconstructions and MIPs were  obtained to evaluate the vascular anatomy. Carotid stenosis measurements (when applicable) are obtained utilizing NASCET criteria, using the distal internal carotid diameter as the denominator. CONTRAST:  75mL OMNIPAQUE IOHEXOL 350 MG/ML SOLN COMPARISON:  None. FINDINGS: Aortic arch: Great vessel origins are patent. Right carotid system: Patent. No stenosis or evidence of dissection. Left carotid system: Patent.  No stenosis or evidence of dissection. Vertebral arteries: Patent. Right vertebral artery is dominant. No stenosis or evidence of dissection. Skeleton: No significant  abnormality. Other neck: Unremarkable. Upper chest: No apical lung mass. IMPRESSION: Patent anterior and posterior circulations in the neck. No stenosis or evidence of dissection. Electronically Signed   By: Guadlupe Spanish M.D.   On: 12/18/2020 17:34   CT Angio Chest PE W and/or Wo Contrast  Result Date: 12/18/2020 CLINICAL DATA:  Chest pain.  Pain radiating to LEFT neck and arm. EXAM: CT ANGIOGRAPHY CHEST WITH CONTRAST TECHNIQUE: Multidetector CT imaging of the chest was performed using the standard protocol during bolus administration of intravenous contrast. Multiplanar CT image reconstructions and MIPs were obtained to evaluate the vascular anatomy. CONTRAST:  68mL OMNIPAQUE IOHEXOL 350 MG/ML SOLN COMPARISON:  None. FINDINGS: Cardiovascular: No filling defects within the pulmonary arteries to suggest acute pulmonary embolism. No contour abnormality of the thoracic aorta. Great vessels normal. No pericardial fluid. No mediastinal hematoma. Mediastinum/Nodes: No axillary or supraclavicular adenopathy. No mediastinal or hilar adenopathy. No pericardial fluid. Esophagus normal. Lungs/Pleura: No pulmonary infarction. No pneumonia. No pleural fluid. No pneumothorax Upper Abdomen: Limited view of the liver, kidneys, pancreas are unremarkable. Normal adrenal glands. Musculoskeletal: No aggressive osseous lesion. Review of the MIP images  confirms the above findings. IMPRESSION: 1. No evidence acute pulmonary embolism. 2. No acute findings of the aorta. 3. No acute pulmonary parenchymal findings. Electronically Signed   By: Genevive Bi M.D.   On: 12/18/2020 18:13     Assessment and Plan:   1. Left neck/shoulder pain - symptoms sound most consistent with MSK etiology - however, inconsistent troponin values noted, possible QC issue, lab notified - CT angio and CXR normal - EKG normal - will discuss further with MD   Risk Assessment/Risk Scores:     HEAR Score (for undifferentiated chest pain):  HEAR Score: 2   For questions or updates, please contact [facility] Please consult [email or web address] for contact info under    Signed, Laurann Montana, PA-C prepping remotely/HAW 12/19/2020 11:20 AM

## 2020-12-20 LAB — TROPONIN I (HIGH SENSITIVITY)

## 2020-12-31 ENCOUNTER — Other Ambulatory Visit: Payer: Self-pay

## 2020-12-31 ENCOUNTER — Ambulatory Visit (INDEPENDENT_AMBULATORY_CARE_PROVIDER_SITE_OTHER): Payer: BC Managed Care – PPO | Admitting: Internal Medicine

## 2020-12-31 ENCOUNTER — Encounter: Payer: Self-pay | Admitting: Internal Medicine

## 2020-12-31 DIAGNOSIS — F4323 Adjustment disorder with mixed anxiety and depressed mood: Secondary | ICD-10-CM

## 2020-12-31 DIAGNOSIS — G44019 Episodic cluster headache, not intractable: Secondary | ICD-10-CM | POA: Diagnosis not present

## 2020-12-31 MED ORDER — BUSPIRONE HCL 5 MG PO TABS: 5.0000 mg | ORAL_TABLET | Freq: Two times a day (BID) | ORAL | 2 refills | Status: DC | PRN

## 2020-12-31 MED ORDER — IBUPROFEN 800 MG PO TABS: 800.0000 mg | ORAL_TABLET | Freq: Three times a day (TID) | ORAL | 0 refills | Status: DC | PRN

## 2020-12-31 NOTE — Patient Instructions (Signed)
We have sent in buspar to use as needed for anxiety.

## 2020-12-31 NOTE — Progress Notes (Signed)
   Subjective:   Patient ID: Lauren Olson, female    DOB: 12-13-1970, 50 y.o.   MRN: [id number]  HPI The patient is a 50 YO female coming in for ER follow up. Doing well overall.   Review of Systems  Constitutional: Negative.   HENT: Negative.    Eyes: Negative.   Respiratory:  Negative for cough, chest tightness and shortness of breath.   Cardiovascular:  Negative for chest pain, palpitations and leg swelling.  Gastrointestinal:  Negative for abdominal distention, abdominal pain, constipation, diarrhea, nausea and vomiting.  Musculoskeletal: Negative.   Skin: Negative.   Neurological: Negative.   Psychiatric/Behavioral:  The patient is nervous/anxious.    Objective:  Physical Exam Constitutional:      Appearance: She is well-developed.  HENT:     Head: Normocephalic and atraumatic.  Cardiovascular:     Rate and Rhythm: Normal rate and regular rhythm.  Pulmonary:     Effort: Pulmonary effort is normal. No respiratory distress.     Breath sounds: Normal breath sounds. No wheezing or rales.  Abdominal:     General: Bowel sounds are normal. There is no distension.     Palpations: Abdomen is soft.     Tenderness: There is no abdominal tenderness. There is no rebound.  Musculoskeletal:     Cervical back: Normal range of motion.  Skin:    General: Skin is warm and dry.  Neurological:     Mental Status: She is alert and oriented to person, place, and time.     Coordination: Coordination normal.    Vitals:   12/31/20 1506  BP: 116/70  Pulse: 68  Resp: 18  Temp: 98.4 F (36.9 C)  TempSrc: Oral  SpO2: 98%  Weight: 150 lb 6.4 oz (68.2 kg)  Height: 5\' 4"  (1.626 m)    This visit occurred during the SARS-CoV-2 public health emergency.  Safety protocols were in place, including screening questions prior to the visit, additional usage of staff PPE, and extensive cleaning of exam room while observing appropriate contact time as indicated for disinfecting solutions.    Assessment & Plan:

## 2021-01-01 ENCOUNTER — Encounter: Payer: Self-pay | Admitting: Internal Medicine

## 2021-01-01 NOTE — Assessment & Plan Note (Signed)
Refill buspar as she is going through a lot of stress right now.

## 2021-01-01 NOTE — Assessment & Plan Note (Signed)
Rx ibuprofen 800 mg TID prn which she uses rarely.

## 2021-01-26 ENCOUNTER — Other Ambulatory Visit: Payer: Self-pay | Admitting: Internal Medicine

## 2021-03-05 DIAGNOSIS — Z309 Encounter for contraceptive management, unspecified: Secondary | ICD-10-CM | POA: Diagnosis not present

## 2021-03-05 DIAGNOSIS — N952 Postmenopausal atrophic vaginitis: Secondary | ICD-10-CM | POA: Diagnosis not present

## 2021-03-05 DIAGNOSIS — N898 Other specified noninflammatory disorders of vagina: Secondary | ICD-10-CM | POA: Diagnosis not present

## 2021-03-15 ENCOUNTER — Other Ambulatory Visit: Payer: Self-pay

## 2021-03-15 ENCOUNTER — Ambulatory Visit
Admission: EM | Admit: 2021-03-15 | Discharge: 2021-03-15 | Disposition: A | Discharge status: Home / Self Care | Payer: BC Managed Care – PPO | Attending: Emergency Medicine | Admitting: Emergency Medicine

## 2021-03-15 DIAGNOSIS — N952 Postmenopausal atrophic vaginitis: Secondary | ICD-10-CM

## 2021-03-15 DIAGNOSIS — B9689 Other specified bacterial agents as the cause of diseases classified elsewhere: Secondary | ICD-10-CM | POA: Diagnosis not present

## 2021-03-15 DIAGNOSIS — N760 Acute vaginitis: Secondary | ICD-10-CM

## 2021-03-15 DIAGNOSIS — R82998 Other abnormal findings in urine: Secondary | ICD-10-CM | POA: Diagnosis not present

## 2021-03-15 LAB — POCT URINALYSIS DIP (MANUAL ENTRY)
Bilirubin, UA: NEGATIVE
Glucose, UA: NEGATIVE mg/dL
Ketones, POC UA: NEGATIVE mg/dL
Nitrite, UA: NEGATIVE
Protein Ur, POC: NEGATIVE mg/dL
Spec Grav, UA: 1.02 (ref 1.010–1.025)
Urobilinogen, UA: 1 E.U./dL
pH, UA: 7 (ref 5.0–8.0)

## 2021-03-15 LAB — POCT URINE PREGNANCY: Preg Test, Ur: NEGATIVE

## 2021-03-15 MED ORDER — ESTRADIOL 10 MCG VA TABS
ORAL_TABLET | VAGINAL | 0 refills | Status: DC
Start: 2021-03-15 — End: 2024-03-01

## 2021-03-15 MED ORDER — METRONIDAZOLE 0.75 % VA GEL: 1.0000 | Freq: Every day | VAGINAL | 0 refills | Status: DC

## 2021-03-15 NOTE — ED Triage Notes (Addendum)
Pt presents with complaints of green vaginal discharge for 2 and a half weeks. Pt reports distinctive odor. Pt was tested for std's and tested negative. Pt is wanting second opinion. Pt does have a new sexual partner.

## 2021-03-15 NOTE — ED Provider Notes (Signed)
[facility]    CSN: [id number] Arrival date & time: 03/15/21  1453      History   Chief Complaint Chief Complaint  Patient presents with   Vaginal Discharge    HPI Lauren Olson is a 50 y.o. female.   New urgent care patient  Patient complains of 2 weeks of vaginal discharge that has a fishy odor, quality is thin and gray.  Patient states he is not currently sexually active.  Patient denies burning with urination, pelvic pressure, pelvic pain, vaginal pruritus.  Patient states he has not tried any remedies for this.  Patient states symptoms are essentially unchanged since I started.    The history is provided by the patient.   Past Medical History:  Diagnosis Date   Migraine    UTI (urinary tract infection)     Patient Active Problem List   Diagnosis Date Noted   Left wrist pain 06/17/2019   Right wrist pain 05/25/2019   Adjustment disorder with mixed anxiety and depressed mood 02/19/2017   Episodic cluster headache, not intractable 02/19/2017   Atopic dermatitis 02/19/2017   Routine general medical examination at a health care facility 06/07/2015    History reviewed. No pertinent surgical history.  OB History   No obstetric history on file.      Home Medications    Prior to Admission medications   Medication Sig Start Date End Date Taking? Authorizing Provider  Estradiol (VAGIFEM) 10 MCG TABS vaginal tablet Insert tablet daily for 14 days, then decrease frequency to twice weekly. 03/15/21  Yes Theadora Rama Scales, PA-C  metroNIDAZOLE (METROGEL) 0.75 % vaginal gel Place 1 Applicatorful vaginally at bedtime for 7 days. 03/15/21 03/22/21 Yes Theadora Rama Scales, PA-C  AMBULATORY NON FORMULARY MEDICATION Soft wrist wrap/brace BL. Use wrist BL .  Disp 2. Patient not taking: Reported on 12/31/2020 06/16/19   Rodolph Bong, MD  busPIRone (BUSPAR) 5 MG tablet TAKE 1 TABLET BY MOUTH TWICE A DAY AS NEEDED FOR ANXIETY 01/30/21   Myrlene Broker, MD   ibuprofen (ADVIL) 800 MG tablet Take 1 tablet (800 mg total) by mouth every 8 (eight) hours as needed. 12/31/20   Myrlene Broker, MD    Family History Family History  Problem Relation Age of Onset   Cancer Mother    Hypertension Mother    Hypertension Father    Hypertension Brother    Breast cancer Neg Hx    CAD Neg Hx     Social History Social History   Tobacco Use   Smoking status: Never   Smokeless tobacco: Never  Vaping Use   Vaping Use: Never used  Substance Use Topics   Alcohol use: No    Alcohol/week: 0.0 standard drinks   Drug use: No     Allergies   Patient has no known allergies.   Review of Systems Review of Systems Pertinent findings noted in history of present illness.    Physical Exam Triage Vital Signs ED Triage Vitals  Enc Vitals Group     BP      Pulse      Resp      Temp      Temp src      SpO2      Weight      Height      Head Circumference      Peak Flow      Pain Score      Pain Loc      Pain  Edu?      Excl. in GC?    No data found.  Updated Vital Signs BP 117/76   Pulse 84   Temp 98 F (36.7 C)   Resp 19   SpO2 97%   Visual Acuity Right Eye Distance:   Left Eye Distance:   Bilateral Distance:    Right Eye Near:   Left Eye Near:    Bilateral Near:     Physical Exam Vitals and nursing note reviewed.  Constitutional:      Appearance: Normal appearance.  HENT:     Head: Normocephalic and atraumatic.     Right Ear: Tympanic membrane, ear canal and external ear normal.     Left Ear: Tympanic membrane, ear canal and external ear normal.     Nose: Nose normal.     Mouth/Throat:     Mouth: Mucous membranes are moist.     Pharynx: Oropharynx is clear.  Eyes:     Extraocular Movements: Extraocular movements intact.     Conjunctiva/sclera: Conjunctivae normal.     Pupils: Pupils are equal, round, and reactive to light.  Cardiovascular:     Rate and Rhythm: Normal rate and regular rhythm.     Pulses: Normal  pulses.     Heart sounds: Normal heart sounds.  Pulmonary:     Effort: Pulmonary effort is normal.     Breath sounds: Normal breath sounds.  Abdominal:     General: Abdomen is flat. Bowel sounds are normal.     Palpations: Abdomen is soft.  Genitourinary:    Comments: Pt politely declines GU exam, pt did provide a swab for testing.   Musculoskeletal:        General: Normal range of motion.     Cervical back: Normal range of motion and neck supple.  Skin:    General: Skin is warm and dry.  Neurological:     General: No focal deficit present.     Mental Status: She is alert and oriented to person, place, and time. Mental status is at baseline.  Psychiatric:        Mood and Affect: Mood normal.        Behavior: Behavior normal.     UC Treatments / Results  Labs (all labs ordered are listed, but only abnormal results are displayed) Labs Reviewed  POCT URINALYSIS DIP (MANUAL ENTRY) - Abnormal; Notable for the following components:      Result Value   Clarity, UA cloudy (*)    Blood, UA trace-intact (*)    Leukocytes, UA Large (3+) (*)    All other components within normal limits  URINE CULTURE  POCT URINE PREGNANCY  CERVICOVAGINAL ANCILLARY ONLY    EKG   Radiology No results found.  Procedures Procedures (including critical care time)  Medications Ordered in UC Medications - No data to display  Initial Impression / Assessment and Plan / UC Course  I have reviewed the triage vital signs and the nursing notes.  Pertinent labs & imaging results that were available during my care of the patient were reviewed by me and considered in my medical decision making (see chart for details).     We will treat patient empirically with metronidazole gel for presumed bacterial vaginitis.  I have also renewed patient's prescription for vaginal estrogen at her request.  Patient will be notified of the results of her vaginal swab once they are received.  Urinalysis today was  positive for large white blood cells likely due to  2 weeks of bacterial vaginitis.  Patient verbalized understanding and agreement of plan as discussed.  All questions were addressed during visit.  Please see discharge instructions below for further details of plan.  Final Clinical Impressions(s) / UC Diagnoses   Final diagnoses:  Vaginitis due to Gardnerella vaginalis  Perimenopausal atrophic vaginitis  Leukocytes in urine     Discharge Instructions      Based on the symptoms you described today, I feel you are suffering from a bacterial vaginosis infection caused by Gardnerella bacteria.  This is a common infection, we see it in little girls as well as grown women, it is not sexually transmitted.  This infection is easily treated with an antibiotic called metronidazole which comes in a tablet in a gel form.  Because we do not have a certain diagnosis of bacterial vaginosis, I recommend that she use the gel form which is placed into the vagina nightly for 7 days.  Once we receive the results of your vaginal swab testing, we will notify you of the findings and provide further treatment if needed.  Our prescription system states that this is the preferred medication on level to 3 of your formulary.  I hope it is affordable for you.  For vaginal dryness and irritation, I recommend that you try Vagifem tablets.  You begin by using 1 tablet per vagina daily for 14 days, this does not necessarily have to be inserted at bedtime but can be inserted anytime a day.  After 2 weeks of daily use, you can we will decrease use to twice weekly.  If you plan on having sexual intercourse, please be sure that you insert the tablet at least 2 hours prior to contact with your partner to allow time for the tablet to absorb and avoid your partner being exposed to estrogen.  Please follow-up with your gynecologist to discuss how this medication works for you and to see if they would be willing to provide you with  refills.  Thank you for visiting urgent care today.     ED Prescriptions     Medication Sig Dispense Auth. Provider   metroNIDAZOLE (METROGEL) 0.75 % vaginal gel Place 1 Applicatorful vaginally at bedtime for 7 days. 70 g Theadora Rama Scales, PA-C   Estradiol (VAGIFEM) 10 MCG TABS vaginal tablet Insert tablet daily for 14 days, then decrease frequency to twice weekly. 18 tablet Theadora Rama Scales, PA-C      PDMP not reviewed this encounter.   Theadora Rama Scales, PA-C 03/17/21 1009

## 2021-03-15 NOTE — Discharge Instructions (Addendum)
Based on the symptoms you described today, I feel you are suffering from a bacterial vaginosis infection caused by Gardnerella bacteria.  This is a common infection, we see it in little girls as well as grown women, it is not sexually transmitted.  This infection is easily treated with an antibiotic called metronidazole which comes in a tablet in a gel form.  Because we do not have a certain diagnosis of bacterial vaginosis, I recommend that she use the gel form which is placed into the vagina nightly for 7 days.  Once we receive the results of your vaginal swab testing, we will notify you of the findings and provide further treatment if needed.  Our prescription system states that this is the preferred medication on level to 3 of your formulary.  I hope it is affordable for you.  For vaginal dryness and irritation, I recommend that you try Vagifem tablets.  You begin by using 1 tablet per vagina daily for 14 days, this does not necessarily have to be inserted at bedtime but can be inserted anytime a day.  After 2 weeks of daily use, you can we will decrease use to twice weekly.  If you plan on having sexual intercourse, please be sure that you insert the tablet at least 2 hours prior to contact with your partner to allow time for the tablet to absorb and avoid your partner being exposed to estrogen.  Please follow-up with your gynecologist to discuss how this medication works for you and to see if they would be willing to provide you with refills.  Thank you for visiting urgent care today.

## 2021-03-19 ENCOUNTER — Telehealth: Payer: Self-pay

## 2021-03-19 MED ORDER — METRONIDAZOLE 500 MG PO TABS: 500.0000 mg | ORAL_TABLET | Freq: Two times a day (BID) | ORAL | 0 refills | Status: DC

## 2021-03-19 NOTE — Telephone Encounter (Signed)
Patient returned call for medication change. Provider made aware and prescription sent.

## 2021-03-19 NOTE — Telephone Encounter (Signed)
Attempt to return call to patient, voicemail full.

## 2021-03-20 LAB — CERVICOVAGINAL ANCILLARY ONLY
Bacterial Vaginitis (gardnerella): NEGATIVE
Candida Glabrata: NEGATIVE
Candida Vaginitis: NEGATIVE
Chlamydia: NEGATIVE
Comment: NEGATIVE
Comment: NEGATIVE
Comment: NEGATIVE
Comment: NEGATIVE
Comment: NEGATIVE
Comment: NORMAL
Neisseria Gonorrhea: NEGATIVE
Trichomonas: NEGATIVE

## 2021-03-22 ENCOUNTER — Ambulatory Visit (INDEPENDENT_AMBULATORY_CARE_PROVIDER_SITE_OTHER): Payer: BC Managed Care – PPO | Admitting: Internal Medicine

## 2021-03-22 ENCOUNTER — Other Ambulatory Visit: Payer: Self-pay | Admitting: Internal Medicine

## 2021-03-22 ENCOUNTER — Encounter: Payer: Self-pay | Admitting: Internal Medicine

## 2021-03-22 ENCOUNTER — Other Ambulatory Visit: Payer: Self-pay

## 2021-03-22 VITALS — BP 126/84 | HR 77 | Temp 98.3°F | Ht 64.0 in | Wt 149.0 lb

## 2021-03-22 DIAGNOSIS — F4323 Adjustment disorder with mixed anxiety and depressed mood: Secondary | ICD-10-CM | POA: Diagnosis not present

## 2021-03-22 DIAGNOSIS — Z0000 Encounter for general adult medical examination without abnormal findings: Secondary | ICD-10-CM | POA: Diagnosis not present

## 2021-03-22 DIAGNOSIS — G44019 Episodic cluster headache, not intractable: Secondary | ICD-10-CM | POA: Diagnosis not present

## 2021-03-22 DIAGNOSIS — Z23 Encounter for immunization: Secondary | ICD-10-CM

## 2021-03-22 MED ORDER — PEN NEEDLES 30G X 5 MM MISC: 0 refills | Status: DC

## 2021-03-22 MED ORDER — EMGALITY 120 MG/ML ~~LOC~~ SOSY: 120.0000 mg | PREFILLED_SYRINGE | SUBCUTANEOUS | 5 refills | Status: DC

## 2021-03-22 NOTE — Assessment & Plan Note (Signed)
Flu shot yearly. Covid-19 booster counseled. Shingrix given 1st. Tetanus up to date. Colonoscopy counseled about colonoscopy versus cologuard and she wishes to think about this. Mammogram up to date with gyn, pap smear up to date with gyn. Counseled about sun safety and mole surveillance. Counseled about the dangers of distracted driving. Given 10 year screening recommendations.

## 2021-03-22 NOTE — Assessment & Plan Note (Signed)
Still going through a lot of stress and using buspar to help without side effects.

## 2021-03-22 NOTE — Progress Notes (Signed)
   Subjective:   Patient ID: Lauren Olson, female    DOB: 1970-08-22, 50 y.o.   MRN: [id number]  HPI The patient is a 50 YO female coming in for physical. Worsening migraines due to menopause and also having symptoms due to menopause.  PMH, [facility], social history reviewed and updated  Review of Systems  Constitutional: Negative.   HENT: Negative.    Eyes: Negative.   Respiratory:  Negative for cough, chest tightness and shortness of breath.   Cardiovascular:  Negative for chest pain, palpitations and leg swelling.  Gastrointestinal:  Negative for abdominal distention, abdominal pain, constipation, diarrhea, nausea and vomiting.  Endocrine: Positive for cold intolerance and heat intolerance.  Musculoskeletal: Negative.   Skin: Negative.   Neurological:  Positive for headaches.  Psychiatric/Behavioral:  Positive for dysphoric mood.    Objective:  Physical Exam Constitutional:      Appearance: She is well-developed.  HENT:     Head: Normocephalic and atraumatic.  Cardiovascular:     Rate and Rhythm: Normal rate and regular rhythm.  Pulmonary:     Effort: Pulmonary effort is normal. No respiratory distress.     Breath sounds: Normal breath sounds. No wheezing or rales.  Abdominal:     General: Bowel sounds are normal. There is no distension.     Palpations: Abdomen is soft.     Tenderness: There is no abdominal tenderness. There is no rebound.  Musculoskeletal:     Cervical back: Normal range of motion.  Skin:    General: Skin is warm and dry.  Neurological:     Mental Status: She is alert and oriented to person, place, and time.     Coordination: Coordination normal.    Vitals:   03/22/21 1521  BP: 126/84  Pulse: 77  Temp: 98.3 F (36.8 C)  TempSrc: Oral  SpO2: 98%  Weight: 149 lb (67.6 kg)  Height: 5\' 4"  (1.626 m)    This visit occurred during the SARS-CoV-2 public health emergency.  Safety protocols were in place, including screening questions prior to the visit,  additional usage of staff PPE, and extensive cleaning of exam room while observing appropriate contact time as indicated for disinfecting solutions.   Assessment & Plan:  Shingrix IM given at visit

## 2021-03-22 NOTE — Patient Instructions (Addendum)
We have sent in emgality which is an injection that you do once a month to help reduce the amount of headache.  Think about colon cancer screening.  Think about the shingles vaccine today.

## 2021-03-22 NOTE — Assessment & Plan Note (Signed)
Not controlled and moderate exacerbation. Rx emgality to try for preventative treatment. She is using ibuprofen 800 mg as needed for migraines and can refill when needed.

## 2021-03-28 ENCOUNTER — Other Ambulatory Visit: Payer: Self-pay

## 2021-03-29 ENCOUNTER — Encounter: Payer: Self-pay | Admitting: Internal Medicine

## 2021-03-29 ENCOUNTER — Ambulatory Visit (INDEPENDENT_AMBULATORY_CARE_PROVIDER_SITE_OTHER): Payer: BC Managed Care – PPO | Admitting: Internal Medicine

## 2021-03-29 VITALS — BP 118/70 | HR 82 | Resp 18 | Ht 64.0 in | Wt 151.2 lb

## 2021-03-29 DIAGNOSIS — R2232 Localized swelling, mass and lump, left upper limb: Secondary | ICD-10-CM | POA: Diagnosis not present

## 2021-03-29 MED ORDER — AZITHROMYCIN 250 MG PO TABS: 1000.0000 mg | ORAL_TABLET | Freq: Once | ORAL | 0 refills | Status: AC

## 2021-03-29 NOTE — Patient Instructions (Addendum)
I would recommend to try an oral probiotic to see if this helps.  We have sent in the azithromycin to take 4 pills at one time to help the discharge.  We will get the mammogram ordered and ultrasound.

## 2021-03-29 NOTE — Assessment & Plan Note (Addendum)
Ordered diagnostic mammogram and ultrasound for evaluation. Does not appear to be abscess or ingrown hair. Recent shingrix 03/22/21

## 2021-03-29 NOTE — Progress Notes (Signed)
   Subjective:   Patient ID: Lauren Olson, female    DOB: 07/12/1970, 50 y.o.   MRN: [id number]  HPI The patient is a 50 YO female coming in for left armpit swelling. Mild soreness.  Review of Systems  Constitutional: Negative.   HENT: Negative.    Eyes: Negative.   Respiratory:  Negative for cough, chest tightness and shortness of breath.   Cardiovascular:  Negative for chest pain, palpitations and leg swelling.  Gastrointestinal:  Negative for abdominal distention, abdominal pain, constipation, diarrhea, nausea and vomiting.  Musculoskeletal: Negative.   Skin: Negative.   Neurological: Negative.   Psychiatric/Behavioral: Negative.     Objective:  Physical Exam Constitutional:      Appearance: She is well-developed.  HENT:     Head: Normocephalic and atraumatic.  Cardiovascular:     Rate and Rhythm: Normal rate and regular rhythm.     Comments: Small <1cm lesion left axillary region without abscess.  Pulmonary:     Effort: Pulmonary effort is normal. No respiratory distress.     Breath sounds: Normal breath sounds. No wheezing or rales.  Abdominal:     General: Bowel sounds are normal. There is no distension.     Palpations: Abdomen is soft.     Tenderness: There is no abdominal tenderness. There is no rebound.  Musculoskeletal:     Cervical back: Normal range of motion.  Skin:    General: Skin is warm and dry.  Neurological:     Mental Status: She is alert and oriented to person, place, and time.     Coordination: Coordination normal.    Vitals:   03/29/21 1516  BP: 118/70  Pulse: 82  Resp: 18  SpO2: 98%  Weight: 151 lb 3.2 oz (68.6 kg)  Height: 5\' 4"  (1.626 m)    This visit occurred during the SARS-CoV-2 public health emergency.  Safety protocols were in place, including screening questions prior to the visit, additional usage of staff PPE, and extensive cleaning of exam room while observing appropriate contact time as indicated for disinfecting solutions.    Assessment & Plan:

## 2021-04-02 ENCOUNTER — Other Ambulatory Visit: Payer: Self-pay

## 2021-04-02 ENCOUNTER — Ambulatory Visit: Payer: Self-pay | Admitting: Nurse Practitioner

## 2021-04-02 ENCOUNTER — Ambulatory Visit: Payer: BC Managed Care – PPO | Admitting: Advanced Practice Midwife

## 2021-04-02 ENCOUNTER — Encounter: Payer: Self-pay | Admitting: Nurse Practitioner

## 2021-04-02 DIAGNOSIS — Z113 Encounter for screening for infections with a predominantly sexual mode of transmission: Secondary | ICD-10-CM

## 2021-04-02 LAB — WET PREP FOR TRICH, YEAST, CLUE
Trichomonas Exam: NEGATIVE
Yeast Exam: NEGATIVE

## 2021-04-02 MED ORDER — CLOTRIMAZOLE-BETAMETHASONE 1-0.05 % EX CREA: 1.0000 "application " | TOPICAL_CREAM | Freq: Every day | CUTANEOUS | 0 refills | Status: DC

## 2021-04-02 NOTE — Progress Notes (Signed)
Pt here for STD screening.  Wet mount results reviewed.  Pt declined condoms. Lonni Dirden M Elizebath Wever, RN ° °

## 2021-04-03 ENCOUNTER — Encounter: Payer: Self-pay | Admitting: Nurse Practitioner

## 2021-04-03 DIAGNOSIS — B009 Herpesviral infection, unspecified: Secondary | ICD-10-CM | POA: Insufficient documentation

## 2021-04-03 NOTE — Progress Notes (Signed)
[facility] Department STI clinic/screening visit  Subjective:  Lauren Olson is a 50 y.o. female being seen today for an STI screening visit. The patient reports they do have symptoms.  Patient reports that they do not desire a pregnancy in the next year.   They reported they are not interested in discussing contraception today.  No LMP recorded. (Menstrual status: Perimenopausal).   Patient has the following medical conditions:   Patient Active Problem List   Diagnosis Date Noted   Left axillary fullness 03/29/2021   Left wrist pain 06/17/2019   Right wrist pain 05/25/2019   Adjustment disorder with mixed anxiety and depressed mood 02/19/2017   Episodic cluster headache, not intractable 02/19/2017   Atopic dermatitis 02/19/2017   Routine general medical examination at a health care facility 06/07/2015    Chief Complaint  Patient presents with   STD screening     HPI  Patient reports to clinic with complaints of light greenish discharge, vaginal irritation, and dryness.   Last HIV test per patient/review of record was 12/2020 Patient reports last pap was 1 year ago.   See flowsheet for further details and programmatic requirements.    The following portions of the patient's history were reviewed and updated as appropriate: allergies, current medications, past medical history, past social history, past surgical history and problem list.  Objective:  There were no vitals filed for this visit.  Physical Exam Constitutional:      Appearance: Normal appearance.  HENT:     Head: Normocephalic and atraumatic.  Pulmonary:     Effort: Pulmonary effort is normal.  Abdominal:     General: Abdomen is flat.     Palpations: Abdomen is soft.  Genitourinary:    General: Normal vulva.     Rectum: Normal.     Comments: External genitalia/pubic area without nits, lice, edema, erythema.  Cyst noted to right side of labia.  Perineum area with light discoloration  (vitiligo). No inguinal adenopathy. Yeast noted. PH 4.5 Cervix without visible lesions. Uterus firm, mobile, nt, no masses, no CMT, no adnexal tenderness or fullness.  Musculoskeletal:     Cervical back: Normal range of motion and neck supple.  Lymphadenopathy:     Cervical: No cervical adenopathy.  Skin:    General: Skin is warm and dry.  Neurological:     Mental Status: She is alert and oriented to person, place, and time.  Psychiatric:        Mood and Affect: Mood normal.        Behavior: Behavior normal.     Assessment and Plan:  Lauren Olson is a 50 y.o. female presenting to the [facility] Department for STI screening  1. Screening examination for venereal disease Patient accepted all screenings including vaginal CT/GC, patient declined bloodwork for HIV/RPR.  Patient meets criteria for HepB screening? No. Ordered? No - no risk noted  Patient meets criteria for HepC screening? No. Ordered? No - , no risk noted   Wet mount reviewed, patient treated for yeast based off of physical exam.   - clotrimazole-betamethasone (LOTRISONE) cream; Apply 1 application topically daily.  Dispense: 30 g; Refill: 0  Discussed time line for [facility] results and that patient will be called with positive results and encouraged patient to call if she had not heard in 2 weeks.  Counseled to return or seek care for continued or worsening symptoms Recommended condom use with all sex    - WET PREP FOR TRICH, YEAST, CLUE -  Chlamydia/Gonorrhea [place] [facility]    No follow-ups on file.  Future Appointments  Date Time Provider Department Center  05/08/2021 10:40 AM [facility] [place] 1 [facility]  05/08/2021 10:50 AM [facility] [place] [facility]    Glenna Fellows, NP

## 2021-05-08 ENCOUNTER — Other Ambulatory Visit: Payer: Self-pay

## 2021-05-08 ENCOUNTER — Ambulatory Visit
Admission: RE | Admit: 2021-05-08 | Discharge: 2021-05-08 | Disposition: A | Discharge status: Home / Self Care | Payer: Medicaid Other | Source: Ambulatory Visit | Attending: Internal Medicine | Admitting: Internal Medicine

## 2021-05-08 DIAGNOSIS — R2232 Localized swelling, mass and lump, left upper limb: Secondary | ICD-10-CM

## 2021-05-08 DIAGNOSIS — R922 Inconclusive mammogram: Secondary | ICD-10-CM | POA: Diagnosis not present

## 2021-05-17 ENCOUNTER — Ambulatory Visit (INDEPENDENT_AMBULATORY_CARE_PROVIDER_SITE_OTHER): Payer: BC Managed Care – PPO | Admitting: *Deleted

## 2021-05-17 ENCOUNTER — Other Ambulatory Visit: Payer: Self-pay

## 2021-05-17 ENCOUNTER — Ambulatory Visit: Payer: BC Managed Care – PPO

## 2021-05-17 DIAGNOSIS — Z23 Encounter for immunization: Secondary | ICD-10-CM

## 2021-05-17 NOTE — Progress Notes (Signed)
Pls cosign for Shingrx inj../lmb  

## 2021-05-27 ENCOUNTER — Encounter: Payer: Self-pay | Admitting: Internal Medicine

## 2021-05-27 ENCOUNTER — Other Ambulatory Visit: Payer: Self-pay

## 2021-05-27 ENCOUNTER — Ambulatory Visit (INDEPENDENT_AMBULATORY_CARE_PROVIDER_SITE_OTHER): Payer: BC Managed Care – PPO | Admitting: Internal Medicine

## 2021-05-27 DIAGNOSIS — F4323 Adjustment disorder with mixed anxiety and depressed mood: Secondary | ICD-10-CM

## 2021-05-27 DIAGNOSIS — R2232 Localized swelling, mass and lump, left upper limb: Secondary | ICD-10-CM

## 2021-05-27 MED ORDER — IBUPROFEN 800 MG PO TABS: 800.0000 mg | ORAL_TABLET | Freq: Three times a day (TID) | ORAL | 5 refills | Status: DC | PRN

## 2021-05-27 MED ORDER — SERTRALINE HCL 25 MG PO TABS: 25.0000 mg | ORAL_TABLET | Freq: Every day | ORAL | 3 refills | Status: DC

## 2021-05-27 MED ORDER — AZITHROMYCIN 250 MG PO TABS: 1000.0000 mg | ORAL_TABLET | ORAL | 0 refills | Status: DC

## 2021-05-27 NOTE — Patient Instructions (Addendum)
We have sent in the ibuprofen and the zoloft 25 mg daily.  We have sent in the refill of the azithromycin to take.

## 2021-05-27 NOTE — Progress Notes (Signed)
° °  Subjective:   Patient ID: Lauren Olson, female    DOB: [birth year], 50 y.o.   MRN: [id number]  HPI The patient is a 50 YO female coming in for follow up mood.   Review of Systems  Constitutional: Negative.   HENT: Negative.    Eyes: Negative.   Respiratory:  Negative for cough, chest tightness and shortness of breath.   Cardiovascular:  Negative for chest pain, palpitations and leg swelling.  Gastrointestinal:  Negative for abdominal distention, abdominal pain, constipation, diarrhea, nausea and vomiting.  Musculoskeletal: Negative.   Skin: Negative.   Neurological: Negative.   Psychiatric/Behavioral: Negative.     Objective:  Physical Exam Constitutional:      Appearance: She is well-developed.  HENT:     Head: Normocephalic and atraumatic.  Cardiovascular:     Rate and Rhythm: Normal rate and regular rhythm.  Pulmonary:     Effort: Pulmonary effort is normal. No respiratory distress.     Breath sounds: Normal breath sounds. No wheezing or rales.  Abdominal:     General: Bowel sounds are normal. There is no distension.     Palpations: Abdomen is soft.     Tenderness: There is no abdominal tenderness. There is no rebound.  Musculoskeletal:     Cervical back: Normal range of motion.  Skin:    General: Skin is warm and dry.  Neurological:     Mental Status: She is alert and oriented to person, place, and time.     Coordination: Coordination normal.    Vitals:   05/27/21 1521  BP: 122/80  Pulse: 63  Resp: 18  SpO2: 98%  Weight: 152 lb (68.9 kg)  Height: 5\' 4"  (1.626 m)    This visit occurred during the SARS-CoV-2 public health emergency.  Safety protocols were in place, including screening questions prior to the visit, additional usage of staff PPE, and extensive cleaning of exam room while observing appropriate contact time as indicated for disinfecting solutions.   Assessment & Plan:

## 2021-05-28 ENCOUNTER — Encounter: Payer: Self-pay | Admitting: Internal Medicine

## 2021-05-28 NOTE — Assessment & Plan Note (Signed)
Did not do well with buspar so will D/C. She did take sister's zoloft 25 mg daily and this has done well. Rx zoloft 25 mg daily and advised to let us know how she is doing or if any problems.

## 2021-05-28 NOTE — Assessment & Plan Note (Signed)
Reviewed benign results of mammogram and [place].

## 2021-10-21 ENCOUNTER — Ambulatory Visit: Payer: BC Managed Care – PPO | Admitting: Internal Medicine

## 2021-10-23 DIAGNOSIS — N898 Other specified noninflammatory disorders of vagina: Secondary | ICD-10-CM | POA: Diagnosis not present

## 2021-10-23 DIAGNOSIS — Z1389 Encounter for screening for other disorder: Secondary | ICD-10-CM | POA: Diagnosis not present

## 2021-10-23 DIAGNOSIS — Z130 Encounter for screening for diseases of the blood and blood-forming organs and certain disorders involving the immune mechanism: Secondary | ICD-10-CM | POA: Diagnosis not present

## 2021-10-23 DIAGNOSIS — Z01411 Encounter for gynecological examination (general) (routine) with abnormal findings: Secondary | ICD-10-CM | POA: Diagnosis not present

## 2021-11-01 ENCOUNTER — Encounter: Payer: Self-pay | Admitting: Internal Medicine

## 2021-11-01 ENCOUNTER — Ambulatory Visit (INDEPENDENT_AMBULATORY_CARE_PROVIDER_SITE_OTHER): Payer: BC Managed Care – PPO | Admitting: Internal Medicine

## 2021-11-01 VITALS — BP 122/70 | HR 80 | Resp 18 | Ht 64.0 in | Wt 155.6 lb

## 2021-11-01 DIAGNOSIS — M542 Cervicalgia: Secondary | ICD-10-CM

## 2021-11-01 DIAGNOSIS — Z1211 Encounter for screening for malignant neoplasm of colon: Secondary | ICD-10-CM | POA: Insufficient documentation

## 2021-11-01 DIAGNOSIS — M25512 Pain in left shoulder: Secondary | ICD-10-CM | POA: Insufficient documentation

## 2021-11-01 NOTE — Assessment & Plan Note (Signed)
Referral to PT done today. Reviewed CT angio chest and neck with her today from ER previous. This is likely musculoskeletal. Use heat and stretching exercise. Okay to use otc for pain as needed.

## 2021-11-01 NOTE — Progress Notes (Signed)
   Subjective:   Patient ID: Lauren Olson, female    DOB: 1971/04/02, 51 y.o.   MRN: [id number]  HPI The patient is a 51 YO female coming in for left sided neck/shoulder pain.   Review of Systems  Constitutional:  Positive for activity change. Negative for appetite change, chills, fatigue, fever and unexpected weight change.  Respiratory: Negative.    Cardiovascular: Negative.   Gastrointestinal: Negative.   Musculoskeletal:  Positive for myalgias. Negative for arthralgias, back pain, gait problem and joint swelling.  Skin: Negative.   Neurological: Negative.    Objective:  Physical Exam Constitutional:      Appearance: She is well-developed.  HENT:     Head: Normocephalic and atraumatic.  Cardiovascular:     Rate and Rhythm: Normal rate and regular rhythm.  Pulmonary:     Effort: Pulmonary effort is normal. No respiratory distress.     Breath sounds: Normal breath sounds. No wheezing or rales.  Abdominal:     General: Bowel sounds are normal. There is no distension.     Palpations: Abdomen is soft.     Tenderness: There is no abdominal tenderness. There is no rebound.  Musculoskeletal:        General: Tenderness present.     Cervical back: Normal range of motion.  Skin:    General: Skin is warm and dry.  Neurological:     Mental Status: She is alert and oriented to person, place, and time.     Coordination: Coordination normal.    Vitals:   11/01/21 1507  BP: 122/70  Pulse: 80  Resp: 18  SpO2: 98%  Weight: 155 lb 9.6 oz (70.6 kg)  Height: 5\' 4"  (1.626 m)    Assessment & Plan:

## 2021-11-01 NOTE — Assessment & Plan Note (Signed)
Counseled about options and she wishes to do cologuard which is ordered today.

## 2021-11-01 NOTE — Assessment & Plan Note (Signed)
Refer to PT and muscles are tight in that region. Reviewed prior CT angio neck and chest with her that did not have acute findings or severe arthritis findings. Given stretching exercises and can use otc medications for pain in the meantime.

## 2021-11-01 NOTE — Patient Instructions (Signed)
Use heat compresses on the area and try stretches.

## 2021-11-18 DIAGNOSIS — Z1211 Encounter for screening for malignant neoplasm of colon: Secondary | ICD-10-CM | POA: Diagnosis not present

## 2021-11-19 NOTE — Therapy (Addendum)
OUTPATIENT PHYSICAL THERAPY EVALUATION  DISCHARGE   Patient Name: Lauren Olson MRN: [id number] DOB:07-Jan-1971, 51 y.o., female Today's Date: 11/20/2021   PT End of Session - 11/20/21 1537     Visit Number 1    Number of Visits 7    Date for PT Re-Evaluation 01/01/22    Authorization Type [employer]    PT Start Time 1525    PT Stop Time 1610    PT Time Calculation (min) 45 min    Activity Tolerance Patient tolerated treatment well    Behavior During Therapy WFL for tasks assessed/performed             Past Medical History:  Diagnosis Date   Migraine    UTI (urinary tract infection)    History reviewed. No pertinent surgical history. Patient Active Problem List   Diagnosis Date Noted   Acute pain of left shoulder 11/01/2021   Neck pain on left side 11/01/2021   Colon cancer screening 11/01/2021   HSV infection 04/03/2021   Left wrist pain 06/17/2019   Right wrist pain 05/25/2019   Adjustment disorder with mixed anxiety and depressed mood 02/19/2017   Episodic cluster headache, not intractable 02/19/2017   Atopic dermatitis 02/19/2017   Routine general medical examination at a health care facility 06/07/2015    PCP: Hoyt Koch, MD  REFERRING PROVIDER: Hoyt Koch, MD  REFERRING DIAG: Acute pain of left shoulder, Neck pain on left side  THERAPY DIAG:  Cervicalgia  Chronic left shoulder pain  Muscle weakness (generalized)  Rationale for Evaluation and Treatment Rehabilitation  ONSET DATE: approximately 2 years ago   SUBJECTIVE:       SUBJECTIVE STATEMENT: Patient reports sharp pain in her left neck, shoulder, and upper back. The pains use to go down her left arm but this has resolved. She went to the hospital and had numerous imaging which was negative. She also notes some tightness behind the left shoulder and upper back. 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 She can also feel a knot  in the left neck and shoulder region when is going to bed. There have been times when the pain will go away for a few days or even a week. She does walk for exercise and states that around 3-4 miles she will start to feel her neck and upper back tighten up.  PERTINENT HISTORY:  None  PAIN:  Are you having pain? Yes:  NPRS scale: 7/10 Pain location: Left sided neck and shoulder pain Pain description: Intermittent, sharp, tight, spasm Aggravating factors: Sitting extended periods, stress Relieving factors: Heating pad, cream  PRECAUTIONS: None  WEIGHT BEARING RESTRICTIONS No  FALLS:  Has patient fallen in last 6 months? No  LIVING ENVIRONMENT: Lives with: lives with their family  OCCUPATION: works at call center, sitting extended periods  PLOF: Independent  PATIENT GOALS: Improve pain and tightness   OBJECTIVE:  PATIENT SURVEYS:  FOTO 58% functional status  COGNITION: Overall cognitive status: Within functional limits for tasks assessed  SENSATION: WFL  POSTURE: rounded shoulders, forward head, and increased thoracic kyphosis   PALPATION: Tender to palpation with notable trigger points upper trap, levator scap, infraspinatus, rhomboid/mid trap   CERVICAL ROM:   Active ROM A/PROM (deg) eval  Flexion 60  Extension 70  Right lateral flexion 40  Left lateral flexion 45  Right rotation 75  Left rotation 75   (Blank rows = not tested)  UPPER  EXTREMITY ROM:   BUE AROM grossly WFL and non painful  UPPER EXTREMITY MMT:  MMT Right eval Left eval  Shoulder flexion 5 5  Shoulder extension 5 5  Shoulder abduction 5 5  Shoulder internal rotation 5 5  Shoulder external rotation 5 5  Middle trapezius 4- 4-  Lower trapezius 4- 4-   (Blank rows = not tested)  CERVICAL SPECIAL TESTS:  Deep neck flexor endurance: 5 seconds   TODAY'S TREATMENT:  Therapeutic Exercises Upper trap stretch 2 x 20 sec Levator scap stretch 2 x 20 sec Supine chin tuck 10 x 5  sec Prone I 10 x 5 sec Sidelying thoracic rotation Trigger Point Dry Needling Treatment: Pre-treatment instruction: Patient instructed on dry needling rationale, procedures, and possible side effects including pain during treatment (achy,cramping feeling), bruising, drop of blood, lightheadedness, nausea, sweating. Patient Consent Given: Yes Education handout provided: No Muscles treated: Upper trap left  Needle size and number: .30x51m x 2 Electrical stimulation performed: No Parameters: N/A Treatment response/outcome: Twitch response elicited and Palpable decrease in muscle tension Post-treatment instructions: Patient instructed to expect possible mild to moderate muscle soreness later today and/or tomorrow. Patient instructed in methods to reduce muscle soreness and to continue prescribed HEP. If patient was dry needled over the lung field, patient was instructed on signs and symptoms of pneumothorax and, however unlikely, to see immediate medical attention should they occur. Patient was also educated on signs and symptoms of infection and to seek medical attention should they occur. Patient verbalized understanding of these instructions and education.   PATIENT EDUCATION:  Education details: Exam findings, POC, HEP, TPDN Person educated: Patient Education method: Explanation, Demonstration, Tactile cues, Verbal cues, and Handouts Education comprehension: verbalized understanding, returned demonstration, verbal cues required, tactile cues required, and needs further education  HOME EXERCISE PROGRAM: Access Code: [email or web address]   ASSESSMENT: CLINICAL IMPRESSION: Patient is a 51y.o. female who was seen today for physical therapy evaluation and treatment for left side neck/shoulder pain and tightness. Her symptoms seems to be postural related as she exhibits rounded shoulder and forward head posture. Utilized TPDN this visit with twitch response for upper trap. She was provided exercises to  stretch the neck musculature and improve DNF and postural endurance.   OBJECTIVE IMPAIRMENTS decreased activity tolerance, decreased strength, increased muscle spasms, postural dysfunction, and pain.   ACTIVITY LIMITATIONS sitting and sleeping  PARTICIPATION LIMITATIONS: meal prep, cleaning, and occupation  PERSONAL FACTORS Profession and Time since onset of injury/illness/exacerbation are also affecting patient's functional outcome.   REHAB POTENTIAL: Good  CLINICAL DECISION MAKING: Stable/uncomplicated  EVALUATION COMPLEXITY: Low   GOALS: Goals reviewed with patient? Yes  SHORT TERM GOALS: Target date: 12/11/2021   Patient will be I with initial HEP in order to progress with therapy. Baseline: HEP provided at eval Goal status: INITIAL  2.  PT will review FOTO with patient by 3rd visit in order to understand expected progress and outcome with therapy. Baseline: FOTO assessed at eval Goal status: INITIAL  LONG TERM GOALS: Target date: 01/01/2022  Patient will be I with final HEP to maintain progress from PT. Baseline: HEP provided at eval Goal status: INITIAL  2.  Patient will report >/= 67% status on FOTO to indicate improved functional ability. Baseline: 58% functional status Goal status: INITIAL  3.  Patient will demonstrate DNF endurance >/= 23 sec and periscapular strength grossly >/= 4/5 MMT to improve sitting tolerance at work. Baseline: DNF endurance 5 sec, periscapular strength grossly 4-/5 MMT Goal  status: INITIAL  4.  Patient will report pain level </= 2/10 in order to reduce functional limitations Baseline: 5/10 pain level Goal status: INITIAL   PLAN: PT FREQUENCY: 1-2x/week  PT DURATION: 6 weeks  PLANNED INTERVENTIONS: Therapeutic exercises, Therapeutic activity, Neuromuscular re-education, Balance training, Gait training, Patient/Family education, Joint manipulation, Joint mobilization, Aquatic Therapy, Dry Needling, Electrical stimulation, Spinal  manipulation, Spinal mobilization, Cryotherapy, Moist heat, Taping, Manual therapy, and Re-evaluation  PLAN FOR NEXT SESSION: Review HEP and progress PRN, dry needling / manual for upper trap and periscapular musculature, thoracic mobility, DNF endurance and periscapular strengthening   Hilda Blades, PT, DPT, LAT, ATC 11/20/21  4:56 PM Phone: [phone] Fax: [phone]    PHYSICAL THERAPY DISCHARGE SUMMARY  Visits from Start of Care: 1  Current functional level related to goals / functional outcomes: See above   Remaining deficits: See above   Education / Equipment: HEP   Patient agrees to discharge. Patient goals were not met. Patient is being discharged due to not returning since the last visit.  Hilda Blades, PT, DPT, LAT, ATC 02/25/22  3:28 PM Phone: [phone] Fax: [phone]

## 2021-11-20 ENCOUNTER — Ambulatory Visit: Payer: BC Managed Care – PPO | Attending: Internal Medicine | Admitting: Physical Therapy

## 2021-11-20 ENCOUNTER — Encounter: Payer: Self-pay | Admitting: Physical Therapy

## 2021-11-20 ENCOUNTER — Other Ambulatory Visit: Payer: Self-pay

## 2021-11-20 DIAGNOSIS — M25512 Pain in left shoulder: Secondary | ICD-10-CM | POA: Diagnosis not present

## 2021-11-20 DIAGNOSIS — M6281 Muscle weakness (generalized): Secondary | ICD-10-CM

## 2021-11-20 DIAGNOSIS — M542 Cervicalgia: Secondary | ICD-10-CM | POA: Diagnosis not present

## 2021-11-20 DIAGNOSIS — G8929 Other chronic pain: Secondary | ICD-10-CM | POA: Diagnosis not present

## 2021-11-20 NOTE — Patient Instructions (Signed)
Access Code: [email or web address] URL: [email or web address] Date: 11/20/2021 Prepared by: Hilda Blades  Exercises - Seated Cervical Sidebending Stretch  - 1-2 x daily - 3 reps - 20 seconds hold - Seated Levator Scapulae Stretch  - 1-2 x daily - 3 reps - 20 seconds hold - Supine Cervical Retraction with Towel  - 1-2 x daily - 2 sets - 10 reps - 5 seconds hold - Prone Scapular Slide with Shoulder Extension  - 1-2 x daily - 2 sets - 10 reps - 5 seconds hold - Sidelying Thoracic Lumbar Rotation  - 1-2 x daily - 10 reps - 5 seconds hold

## 2021-11-26 LAB — COLOGUARD: COLOGUARD: NEGATIVE

## 2021-12-02 ENCOUNTER — Ambulatory Visit: Payer: BC Managed Care – PPO | Admitting: Physical Therapy

## 2021-12-31 ENCOUNTER — Ambulatory Visit: Payer: BC Managed Care – PPO | Attending: Internal Medicine | Admitting: Physical Therapy

## 2022-02-04 ENCOUNTER — Encounter: Payer: Self-pay | Admitting: Internal Medicine

## 2022-02-04 ENCOUNTER — Ambulatory Visit (INDEPENDENT_AMBULATORY_CARE_PROVIDER_SITE_OTHER): Payer: BC Managed Care – PPO | Admitting: Internal Medicine

## 2022-02-04 VITALS — BP 122/80 | HR 70 | Temp 98.3°F | Ht 64.0 in | Wt 154.0 lb

## 2022-02-04 DIAGNOSIS — Z0000 Encounter for general adult medical examination without abnormal findings: Secondary | ICD-10-CM

## 2022-02-04 DIAGNOSIS — N898 Other specified noninflammatory disorders of vagina: Secondary | ICD-10-CM | POA: Diagnosis not present

## 2022-02-04 DIAGNOSIS — G44019 Episodic cluster headache, not intractable: Secondary | ICD-10-CM | POA: Diagnosis not present

## 2022-02-04 LAB — CBC
HCT: 37.3 % (ref 36.0–46.0)
Hemoglobin: 12.7 g/dL (ref 12.0–15.0)
MCHC: 34.1 g/dL (ref 30.0–36.0)
MCV: 90.5 fl (ref 78.0–100.0)
Platelets: 253 10*3/uL (ref 150.0–400.0)
RBC: 4.12 Mil/uL (ref 3.87–5.11)
RDW: 12.3 % (ref 11.5–15.5)
WBC: 6.5 10*3/uL (ref 4.0–10.5)

## 2022-02-04 LAB — FOLLICLE STIMULATING HORMONE: FSH: 85.9 m[IU]/mL

## 2022-02-04 NOTE — Patient Instructions (Signed)
We will check the labs today. 

## 2022-02-04 NOTE — Progress Notes (Unsigned)
   Subjective:   Patient ID: Lauren Olson, female    DOB: 1970-06-18, 51 y.o.   MRN: [id number]  HPI The patient is a 51 YO female coming in for desiring STD screening and follow up migraines.  Review of Systems  Constitutional: Negative.   HENT: Negative.    Eyes: Negative.   Respiratory:  Negative for cough, chest tightness and shortness of breath.   Cardiovascular:  Negative for chest pain, palpitations and leg swelling.  Gastrointestinal:  Negative for abdominal distention, abdominal pain, constipation, diarrhea, nausea and vomiting.  Genitourinary:  Positive for vaginal discharge.  Musculoskeletal: Negative.   Skin: Negative.   Neurological:  Positive for headaches.  Psychiatric/Behavioral: Negative.      Objective:  Physical Exam Constitutional:      Appearance: She is well-developed.  HENT:     Head: Normocephalic and atraumatic.  Cardiovascular:     Rate and Rhythm: Normal rate and regular rhythm.  Pulmonary:     Effort: Pulmonary effort is normal. No respiratory distress.     Breath sounds: Normal breath sounds. No wheezing or rales.  Abdominal:     General: Bowel sounds are normal. There is no distension.     Palpations: Abdomen is soft.     Tenderness: There is no abdominal tenderness. There is no rebound.  Musculoskeletal:     Cervical back: Normal range of motion.  Skin:    General: Skin is warm and dry.  Neurological:     Mental Status: She is alert and oriented to person, place, and time.     Coordination: Coordination normal.     Vitals:   02/04/22 1522  BP: 122/80  Pulse: 70  Temp: 98.3 F (36.8 C)  TempSrc: Oral  SpO2: 97%  Weight: 154 lb (69.9 kg)  Height: 5\' 4"  (1.626 m)    Assessment & Plan:

## 2022-02-05 LAB — LIPID PANEL
Cholesterol: 150 mg/dL (ref 0–200)
HDL: 51.5 mg/dL (ref >39.00)
LDL Cholesterol: 86 mg/dL (ref 0–99)
NonHDL: 98.11
Total CHOL/HDL Ratio: 3
Triglycerides: 62 mg/dL (ref 0.0–149.0)
VLDL: 12.4 mg/dL (ref 0.0–40.0)

## 2022-02-05 LAB — COMPREHENSIVE METABOLIC PANEL
ALT: 13 U/L (ref 0–35)
AST: 21 U/L (ref 0–37)
Albumin: 4.4 g/dL (ref 3.5–5.2)
Alkaline Phosphatase: 73 U/L (ref 39–117)
BUN: 10 mg/dL (ref 6–23)
CO2: 27 mEq/L (ref 19–32)
Calcium: 9.5 mg/dL (ref 8.4–10.5)
Chloride: 104 mEq/L (ref 96–112)
Creatinine, Ser: 0.67 mg/dL (ref 0.40–1.20)
GFR: 100.84 mL/min (ref >60.00)
Glucose, Bld: 74 mg/dL (ref 70–99)
Potassium: 4 mEq/L (ref 3.5–5.1)
Sodium: 138 mEq/L (ref 135–145)
Total Bilirubin: 0.5 mg/dL (ref 0.2–1.2)
Total Protein: 7.9 g/dL (ref 6.0–8.3)

## 2022-02-05 LAB — RPR: RPR Ser Ql: NONREACTIVE

## 2022-02-05 LAB — HEPATITIS B SURFACE ANTIBODY, QUANTITATIVE: Hep B S AB Quant (Post): <5 m[IU]/mL — ABNORMAL LOW (ref >=10)

## 2022-02-05 LAB — HEPATITIS C ANTIBODY: Hepatitis C Ab: NONREACTIVE

## 2022-02-06 ENCOUNTER — Telehealth: Payer: Self-pay

## 2022-02-06 ENCOUNTER — Encounter: Payer: Self-pay | Admitting: Internal Medicine

## 2022-02-06 DIAGNOSIS — N898 Other specified noninflammatory disorders of vagina: Secondary | ICD-10-CM | POA: Insufficient documentation

## 2022-02-06 NOTE — Assessment & Plan Note (Signed)
Checking FSH and STD screening. Recent GC/chlamydia and HIV so not repeated as not currently sexually active.

## 2022-02-06 NOTE — Assessment & Plan Note (Signed)
Potential risk for hep b so needs quantitative hep b surface ab to assess for immunity. If not adequate repeat hep b vaccine.

## 2022-02-06 NOTE — Assessment & Plan Note (Signed)
Using ibuprofen 800 mg for headaches and we had prescribed emgality which was not affordable to help with prevention.

## 2022-02-06 NOTE — Telephone Encounter (Signed)
-----   Message from Myrlene Broker, MD sent at 02/06/2022  7:48 AM EDT ----- The STD screening negative. We did also check for immunity to hepatitis B and you are not immune. I would recommend to repeat the hepatitis B vaccine at our office which is 1 shot and then a second shot 1 month later. Otherwise normal.

## 2022-02-11 ENCOUNTER — Ambulatory Visit (INDEPENDENT_AMBULATORY_CARE_PROVIDER_SITE_OTHER): Payer: BC Managed Care – PPO | Admitting: *Deleted

## 2022-02-11 DIAGNOSIS — Z23 Encounter for immunization: Secondary | ICD-10-CM

## 2022-02-13 ENCOUNTER — Ambulatory Visit: Payer: BC Managed Care – PPO

## 2022-03-12 ENCOUNTER — Ambulatory Visit (INDEPENDENT_AMBULATORY_CARE_PROVIDER_SITE_OTHER): Payer: BC Managed Care – PPO

## 2022-03-12 DIAGNOSIS — Z23 Encounter for immunization: Secondary | ICD-10-CM | POA: Diagnosis not present

## 2022-03-12 NOTE — Progress Notes (Signed)
After obtaining consent, and per orders of Dr. Sharlet Salina, injection of Hep B given in the left deltoid by Marrian Salvage. Patient tolerated well and instructed to report any adverse reaction to me immediately.

## 2022-04-24 DIAGNOSIS — N898 Other specified noninflammatory disorders of vagina: Secondary | ICD-10-CM | POA: Diagnosis not present

## 2022-04-24 DIAGNOSIS — Z113 Encounter for screening for infections with a predominantly sexual mode of transmission: Secondary | ICD-10-CM | POA: Diagnosis not present

## 2022-04-24 DIAGNOSIS — R102 Pelvic and perineal pain: Secondary | ICD-10-CM | POA: Diagnosis not present

## 2022-05-20 ENCOUNTER — Ambulatory Visit: Payer: BC Managed Care – PPO | Admitting: Internal Medicine

## 2022-05-27 ENCOUNTER — Encounter: Payer: Self-pay | Admitting: Internal Medicine

## 2022-05-27 ENCOUNTER — Ambulatory Visit (INDEPENDENT_AMBULATORY_CARE_PROVIDER_SITE_OTHER): Payer: BC Managed Care – PPO | Admitting: Internal Medicine

## 2022-05-27 VITALS — BP 112/80 | HR 72 | Temp 97.7°F | Ht 64.0 in | Wt 162.0 lb

## 2022-05-27 DIAGNOSIS — R102 Pelvic and perineal pain: Secondary | ICD-10-CM | POA: Diagnosis not present

## 2022-05-27 MED ORDER — FLUCONAZOLE 150 MG PO TABS: 150.0000 mg | ORAL_TABLET | ORAL | 0 refills | Status: DC

## 2022-05-27 NOTE — Progress Notes (Unsigned)
   Subjective:   Patient ID: Lauren Olson, female    DOB: 04-13-1971, 51 y.o.   MRN: [id number]  HPI The patient is a 51 YO female coming in for pelvic pain and recurrent BV. No current discharge.  Review of Systems  Constitutional: Negative.   HENT: Negative.    Eyes: Negative.   Respiratory:  Negative for cough, chest tightness and shortness of breath.   Cardiovascular:  Negative for chest pain, palpitations and leg swelling.  Gastrointestinal:  Negative for abdominal distention, abdominal pain, constipation, diarrhea, nausea and vomiting.  Genitourinary:  Positive for pelvic pain.  Musculoskeletal: Negative.   Skin: Negative.   Neurological: Negative.   Psychiatric/Behavioral: Negative.      Objective:  Physical Exam Constitutional:      Appearance: She is well-developed.  HENT:     Head: Normocephalic and atraumatic.  Cardiovascular:     Rate and Rhythm: Normal rate and regular rhythm.  Pulmonary:     Effort: Pulmonary effort is normal. No respiratory distress.     Breath sounds: Normal breath sounds. No wheezing or rales.  Abdominal:     General: Bowel sounds are normal. There is no distension.     Palpations: Abdomen is soft.     Tenderness: There is no abdominal tenderness. There is no rebound.  Musculoskeletal:     Cervical back: Normal range of motion.  Skin:    General: Skin is warm and dry.  Neurological:     Mental Status: She is alert and oriented to person, place, and time.     Coordination: Coordination normal.     Vitals:   05/27/22 1519  BP: 112/80  Pulse: 72  Temp: 97.7 F (36.5 C)  TempSrc: Oral  SpO2: 99%  Weight: 162 lb (73.5 kg)  Height: 5\' 4"  (1.626 m)    Assessment & Plan:

## 2022-05-27 NOTE — Patient Instructions (Signed)
We will get the test today for gonorrhea and the ultrasound

## 2022-05-28 DIAGNOSIS — R102 Pelvic and perineal pain: Secondary | ICD-10-CM | POA: Insufficient documentation

## 2022-05-28 NOTE — Assessment & Plan Note (Signed)
New pelvis pain getting US pelvis to assess. She does have recurrent BV and not clearly current symptoms. Wants to be checked for GC/chlamydia so this is done today. Recent other STI screening.

## 2022-05-29 LAB — GC/CHLAMYDIA PROBE AMP
Chlamydia trachomatis, NAA: NEGATIVE
Neisseria Gonorrhoeae by PCR: NEGATIVE

## 2022-06-13 ENCOUNTER — Telehealth: Payer: Self-pay | Admitting: Internal Medicine

## 2022-09-15 ENCOUNTER — Other Ambulatory Visit: Payer: Self-pay | Admitting: Internal Medicine

## 2022-09-15 DIAGNOSIS — Z1231 Encounter for screening mammogram for malignant neoplasm of breast: Secondary | ICD-10-CM

## 2022-10-22 ENCOUNTER — Ambulatory Visit: Payer: BC Managed Care – PPO

## 2022-11-23 NOTE — Progress Notes (Signed)
Noted patient has had follow up labs 

## 2022-11-26 ENCOUNTER — Ambulatory Visit
Admission: RE | Admit: 2022-11-26 | Discharge: 2022-11-26 | Disposition: A | Discharge status: Home / Self Care | Payer: BC Managed Care – PPO | Source: Ambulatory Visit | Attending: Internal Medicine | Admitting: Internal Medicine

## 2022-11-26 DIAGNOSIS — Z1231 Encounter for screening mammogram for malignant neoplasm of breast: Secondary | ICD-10-CM | POA: Diagnosis not present

## 2022-11-28 ENCOUNTER — Encounter: Payer: Self-pay | Admitting: Internal Medicine

## 2022-11-28 LAB — HM MAMMOGRAPHY

## 2022-12-22 DIAGNOSIS — Z01411 Encounter for gynecological examination (general) (routine) with abnormal findings: Secondary | ICD-10-CM | POA: Diagnosis not present

## 2022-12-22 DIAGNOSIS — R109 Unspecified abdominal pain: Secondary | ICD-10-CM | POA: Diagnosis not present

## 2023-01-08 DIAGNOSIS — R1031 Right lower quadrant pain: Secondary | ICD-10-CM | POA: Diagnosis not present

## 2023-08-28 IMAGING — MG DIGITAL DIAGNOSTIC BILAT W/ TOMO W/ CAD
6 of 10 series · 6 of 30 positions shown · non-contrast
Comparison: Previous exam(s).

CLINICAL DATA: Mass felt by the patient in the left axilla 2 months
ago. This has subsequently resolved. No current complaints.

EXAM:
DIGITAL DIAGNOSTIC BILATERAL MAMMOGRAM WITH TOMOSYNTHESIS AND CAD;
ULTRASOUND LEFT BREAST LIMITED
TECHNIQUE: Bilateral digital diagnostic mammography and breast tomosynthesis
was performed. The images were evaluated with computer-aided
detection.; Targeted ultrasound examination of the left breast was
performed.

[R MLO synth-2D]
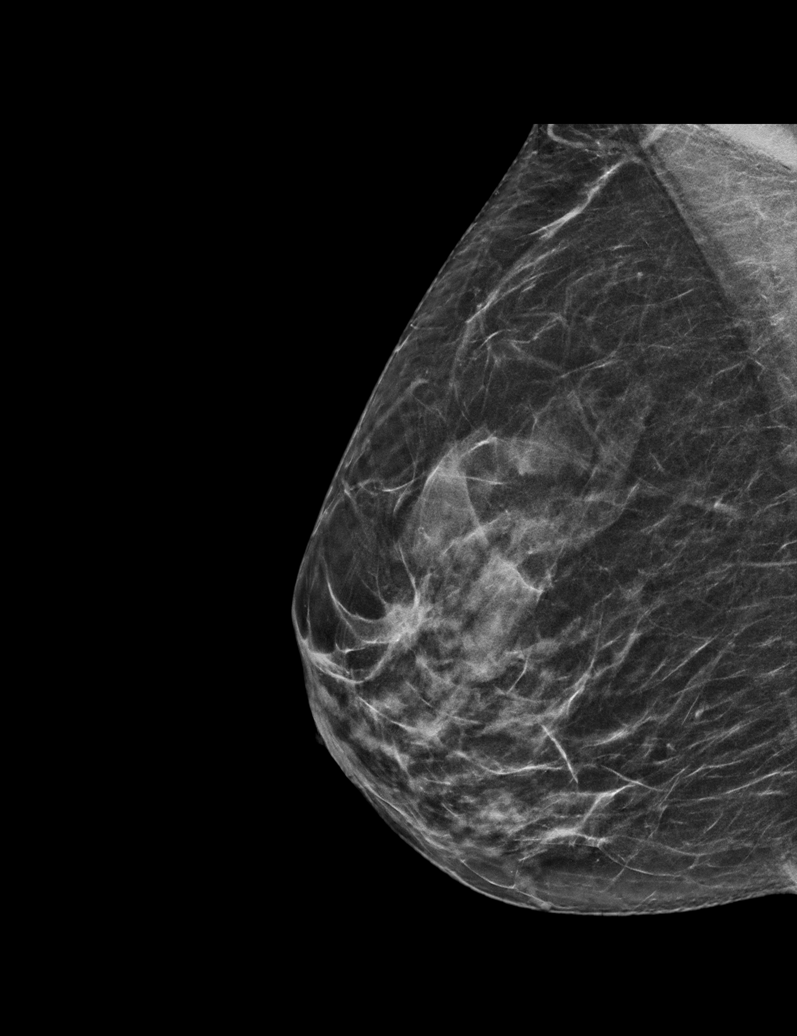

[L MLO synth-2D (1 of 2)]
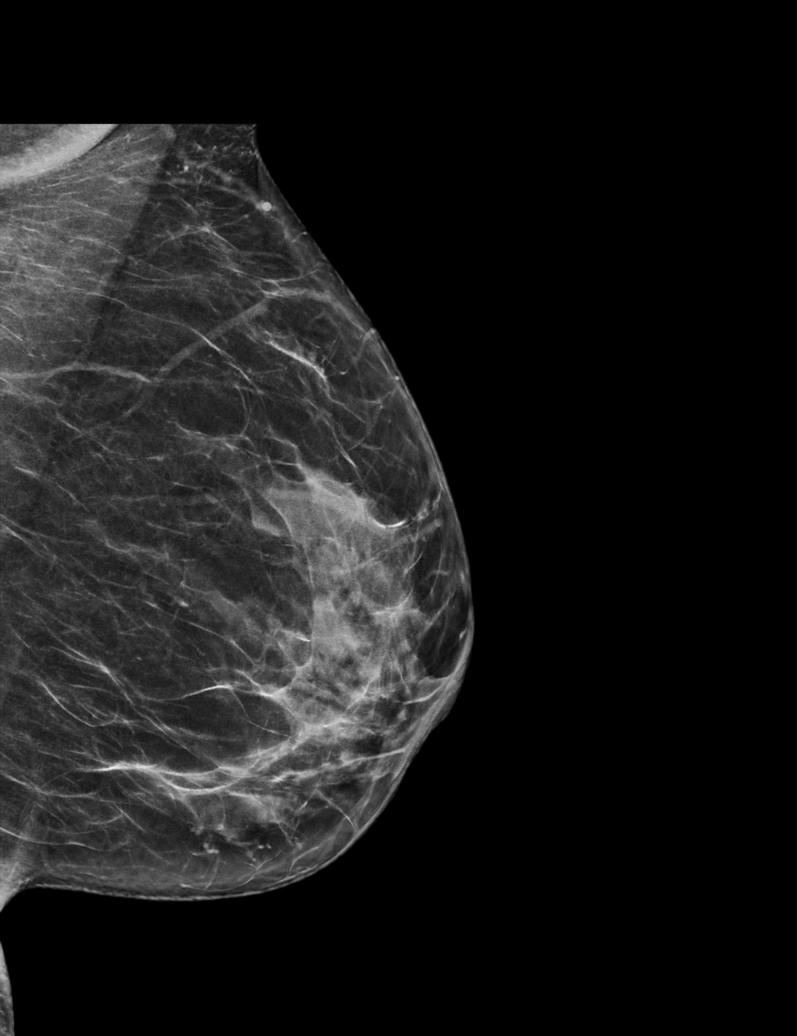

[L MLO synth-2D (2 of 2)]
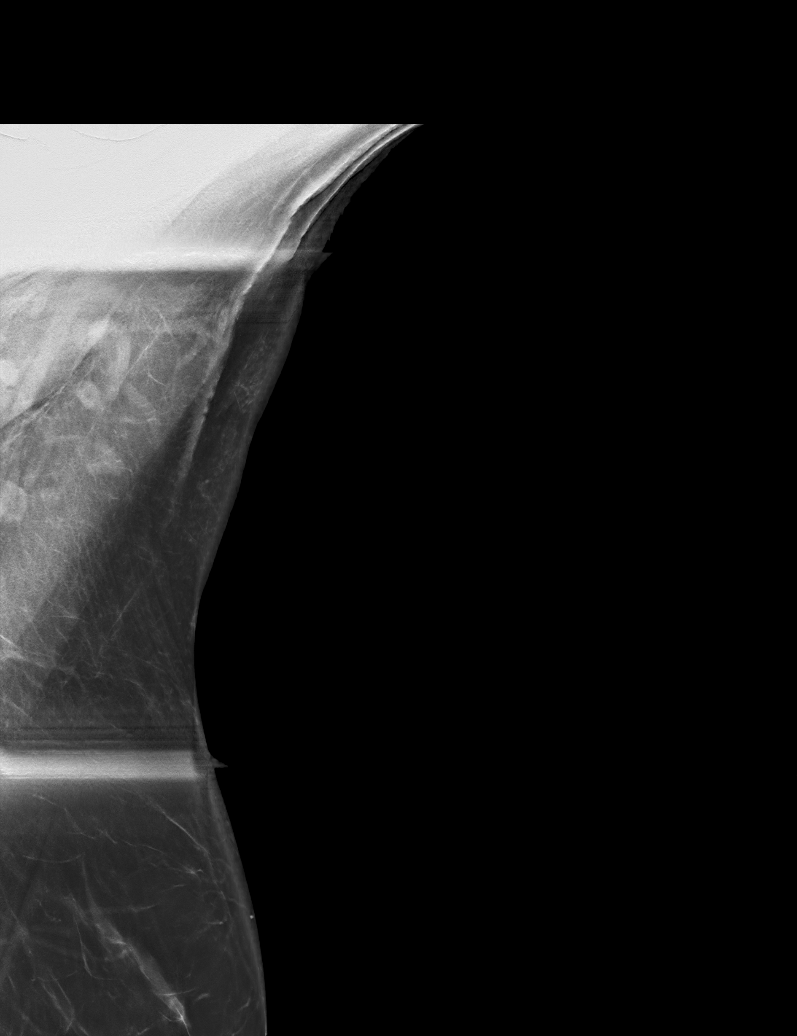

[R CC synth-2D]
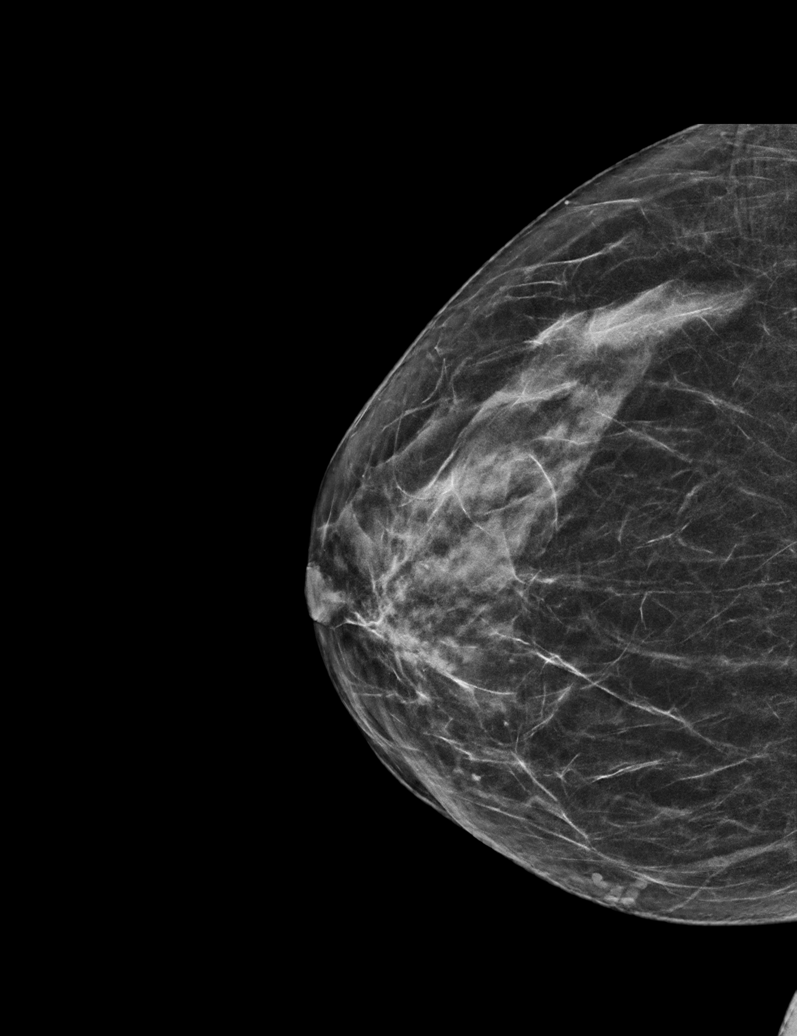

[L CC synth-2D]
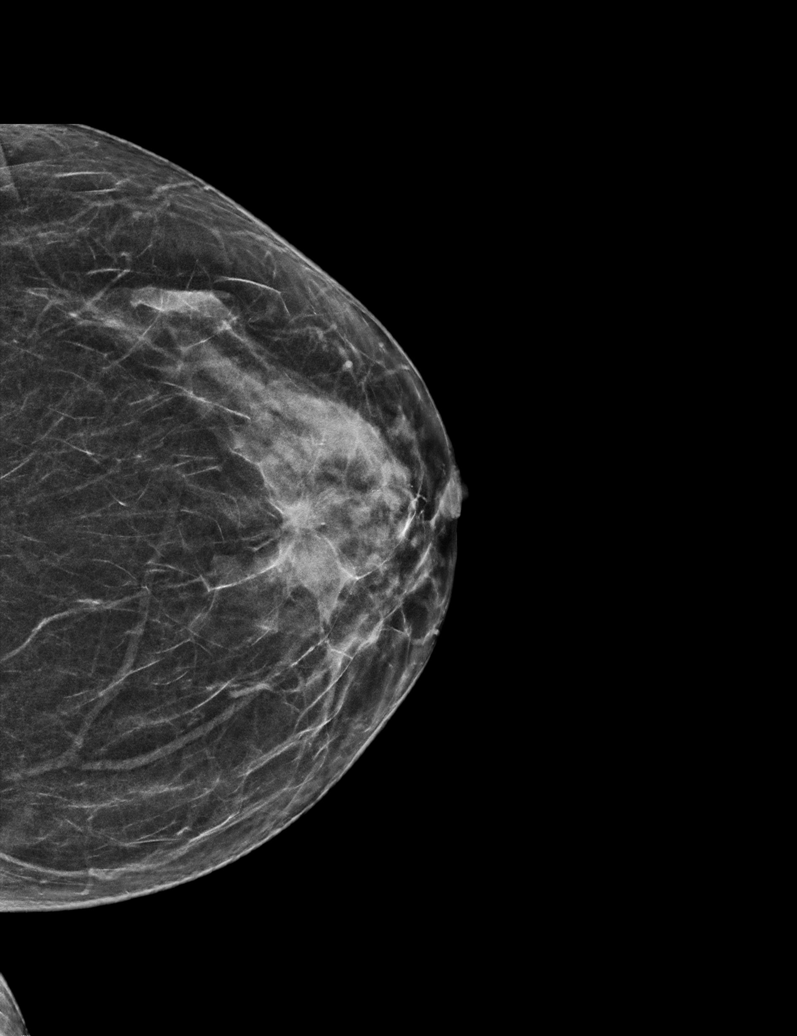

[R CC tomo · tomo slice 29/56.0]
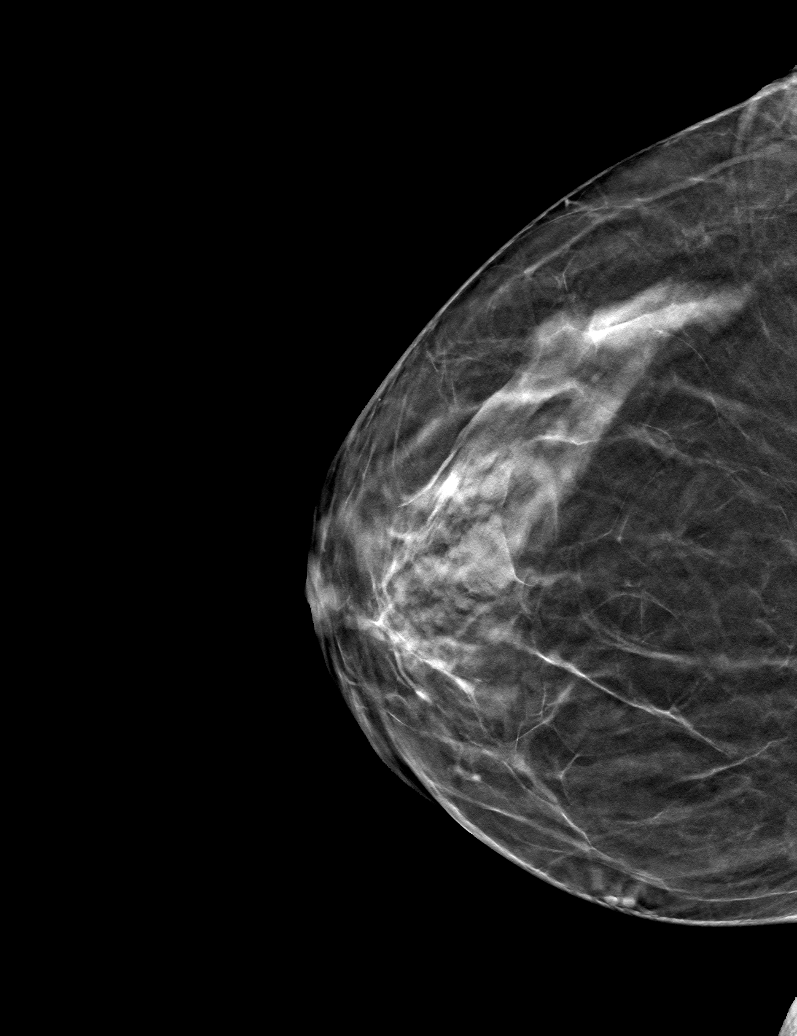

[6 of 30 positions shown; findings below may reference images not displayed]

ACR Breast Density Category c: The breast tissue is heterogeneously
dense, which may obscure small masses.
FINDINGS: On the 2D craniocaudal image of the left breast, there is a
suggestion of focal distortion in the 12 o'clock position of the
breast anteriorly. However, on the 3D images, this is shown to
represent overlapping vessels and ligaments. No findings elsewhere
in either breast suspicious for malignancy and no axillary
abnormalities are demonstrated. There are symmetrical normal sized
bilateral inferior axillary lymph nodes.

On physical exam, there is dense glandular tissue in the anterior 12
o'clock position of the left breast with no discrete palpable mass.
There are no palpable left axillary lymph nodes.

Targeted ultrasound is performed, showing normal appearing breast
tissue in the anterior 12 o'clock position of the left breast. There
were also normal left axillary contents, including normal appearing
lymph nodes.
IMPRESSION: No evidence of malignancy.

RECOMMENDATION:
Bilateral screening mammogram in 1 year.

I have discussed the findings and recommendations with the patient.
If applicable, a reminder letter will be sent to the patient
regarding the next appointment.

BI-RADS CATEGORY  1: Negative.

## 2023-10-23 DIAGNOSIS — M461 Sacroiliitis, not elsewhere classified: Secondary | ICD-10-CM | POA: Diagnosis not present

## 2023-10-23 DIAGNOSIS — M9905 Segmental and somatic dysfunction of pelvic region: Secondary | ICD-10-CM | POA: Diagnosis not present

## 2023-10-23 DIAGNOSIS — M9902 Segmental and somatic dysfunction of thoracic region: Secondary | ICD-10-CM | POA: Diagnosis not present

## 2023-10-23 DIAGNOSIS — M9903 Segmental and somatic dysfunction of lumbar region: Secondary | ICD-10-CM | POA: Diagnosis not present

## 2023-11-03 ENCOUNTER — Ambulatory Visit: Admitting: Internal Medicine

## 2023-11-03 DIAGNOSIS — M9902 Segmental and somatic dysfunction of thoracic region: Secondary | ICD-10-CM | POA: Diagnosis not present

## 2023-11-03 DIAGNOSIS — M9903 Segmental and somatic dysfunction of lumbar region: Secondary | ICD-10-CM | POA: Diagnosis not present

## 2023-11-03 DIAGNOSIS — M461 Sacroiliitis, not elsewhere classified: Secondary | ICD-10-CM | POA: Diagnosis not present

## 2023-11-03 DIAGNOSIS — M9905 Segmental and somatic dysfunction of pelvic region: Secondary | ICD-10-CM | POA: Diagnosis not present

## 2024-03-01 ENCOUNTER — Encounter: Payer: Self-pay | Admitting: Internal Medicine

## 2024-03-01 ENCOUNTER — Encounter: Admitting: Internal Medicine

## 2024-03-01 ENCOUNTER — Ambulatory Visit (INDEPENDENT_AMBULATORY_CARE_PROVIDER_SITE_OTHER): Admitting: Internal Medicine

## 2024-03-01 VITALS — BP 122/80 | HR 78 | Temp 97.7°F | Resp 16 | Ht 66.0 in | Wt 160.0 lb

## 2024-03-01 DIAGNOSIS — G44019 Episodic cluster headache, not intractable: Secondary | ICD-10-CM

## 2024-03-01 DIAGNOSIS — Z0000 Encounter for general adult medical examination without abnormal findings: Secondary | ICD-10-CM

## 2024-03-01 DIAGNOSIS — L209 Atopic dermatitis, unspecified: Secondary | ICD-10-CM | POA: Diagnosis not present

## 2024-03-01 MED ORDER — IBUPROFEN 800 MG PO TABS: 800.0000 mg | ORAL_TABLET | Freq: Three times a day (TID) | ORAL | 5 refills | Status: AC | PRN

## 2024-03-01 MED ORDER — TRIAMCINOLONE ACETONIDE 0.1 % EX CREA: 1.0000 | TOPICAL_CREAM | Freq: Two times a day (BID) | CUTANEOUS | 3 refills | Status: AC

## 2024-03-01 MED ORDER — FLUCONAZOLE 150 MG PO TABS: 150.0000 mg | ORAL_TABLET | ORAL | 3 refills | Status: AC

## 2024-03-01 NOTE — Assessment & Plan Note (Signed)
 Rx triamcinolone  as she is out.

## 2024-03-01 NOTE — Progress Notes (Signed)
   Subjective:   Patient ID: Lauren Olson, female    DOB: 1971-02-24, 53 y.o.   MRN: [id number]  The patient is here for physical. Pertinent topics discussed: Discussed the use of AI scribe software for clinical note transcription with the patient, who gave verbal consent to proceed.  History of Present Illness Lauren Olson is a 53 year old female who presents with migraines exacerbated by recent orthodontic treatment.  She has experienced increased migraines since having braces installed, attributing this to the movement and realignment of her jaw. The sensation is described as feeling like her face is being twisted, causing significant discomfort and pain in her jaw and facial muscles.  Two weeks ago, she experienced chest pain while at the beach, which she associates with eating pizza. The pain was relieved by taking baking soda with water and a Tums. No new chest pains, breathing issues, or stomach troubles such as diarrhea or constipation.    PMH, [facility], social history reviewed and updated  Review of Systems  Constitutional: Negative.   HENT: Negative.    Eyes: Negative.   Respiratory:  Negative for cough, chest tightness and shortness of breath.   Cardiovascular:  Negative for chest pain, palpitations and leg swelling.  Gastrointestinal:  Negative for abdominal distention, abdominal pain, constipation, diarrhea, nausea and vomiting.  Musculoskeletal: Negative.   Skin: Negative.   Neurological: Negative.   Psychiatric/Behavioral: Negative.      Objective:  Physical Exam Constitutional:      Appearance: She is well-developed.  HENT:     Head: Normocephalic and atraumatic.  Cardiovascular:     Rate and Rhythm: Normal rate and regular rhythm.  Pulmonary:     Effort: Pulmonary effort is normal. No respiratory distress.     Breath sounds: Normal breath sounds. No wheezing or rales.  Abdominal:     General: Bowel sounds are normal. There is no distension.     Palpations:  Abdomen is soft.     Tenderness: There is no abdominal tenderness.  Musculoskeletal:     Cervical back: Normal range of motion.  Skin:    General: Skin is warm and dry.  Neurological:     Mental Status: She is alert and oriented to person, place, and time.     Coordination: Coordination normal.     Vitals:   03/01/24 1404  BP: 122/80  Pulse: 78  Resp: 16  Temp: 97.7 F (36.5 C)  SpO2: 98%  Weight: 160 lb (72.6 kg)  Height: 5' 6 (1.676 m)    Assessment & Plan:

## 2024-03-01 NOTE — Assessment & Plan Note (Signed)
 Flu shot counseled. Pneumonia counseled. Shingrix  complete. Tetanus due. Cologuard up to date. Mammogram up to date, pap smear due at gyn. Counseled about sun safety and mole surveillance. Counseled about the dangers of distracted driving. Given 10 year screening recommendations.

## 2024-03-01 NOTE — Assessment & Plan Note (Signed)
 Rx ibuprofen  800 mg which she takes rarely.

## 2024-03-04 ENCOUNTER — Other Ambulatory Visit (HOSPITAL_COMMUNITY)
Admission: RE | Admit: 2024-03-04 | Discharge: 2024-03-04 | Disposition: A | Source: Ambulatory Visit | Attending: Obstetrics and Gynecology | Admitting: Obstetrics and Gynecology

## 2024-03-04 ENCOUNTER — Other Ambulatory Visit: Payer: Self-pay | Admitting: Obstetrics and Gynecology

## 2024-03-04 DIAGNOSIS — Z01419 Encounter for gynecological examination (general) (routine) without abnormal findings: Secondary | ICD-10-CM | POA: Insufficient documentation

## 2024-03-08 LAB — CYTOLOGY - PAP
Comment: NEGATIVE
Diagnosis: NEGATIVE
High risk HPV: NEGATIVE

## 2025-03-01 ENCOUNTER — Ambulatory Visit: Admitting: Internal Medicine
